# Patient Record
Sex: Female | Born: 1940 | Race: Asian | Hispanic: No | Smoking: Never smoker
Health system: Southern US, Community
[De-identification: ages and names within clinical notes are randomized; demographics above are authoritative.]

## PROBLEM LIST (undated history)

## (undated) DIAGNOSIS — R7303 Prediabetes: Secondary | ICD-10-CM

## (undated) DIAGNOSIS — J449 Chronic obstructive pulmonary disease, unspecified: Secondary | ICD-10-CM

## (undated) DIAGNOSIS — E785 Hyperlipidemia, unspecified: Secondary | ICD-10-CM

## (undated) DIAGNOSIS — I509 Heart failure, unspecified: Secondary | ICD-10-CM

## (undated) DIAGNOSIS — I10 Essential (primary) hypertension: Secondary | ICD-10-CM

## (undated) DIAGNOSIS — J479 Bronchiectasis, uncomplicated: Secondary | ICD-10-CM

## (undated) DIAGNOSIS — J849 Interstitial pulmonary disease, unspecified: Secondary | ICD-10-CM

## (undated) HISTORY — DX: Interstitial pulmonary disease, unspecified: J84.9

## (undated) HISTORY — DX: Prediabetes: R73.03

## (undated) HISTORY — DX: Bronchiectasis, uncomplicated: J47.9

## (undated) HISTORY — DX: Chronic obstructive pulmonary disease, unspecified: J44.9

## (undated) HISTORY — PX: OTHER SURGICAL HISTORY: SHX169

---

## 2013-02-18 DIAGNOSIS — I10 Essential (primary) hypertension: Secondary | ICD-10-CM | POA: Insufficient documentation

## 2013-11-09 ENCOUNTER — Encounter (HOSPITAL_BASED_OUTPATIENT_CLINIC_OR_DEPARTMENT_OTHER): Payer: Self-pay | Admitting: Emergency Medicine

## 2013-11-09 ENCOUNTER — Emergency Department (HOSPITAL_BASED_OUTPATIENT_CLINIC_OR_DEPARTMENT_OTHER): Payer: Medicare Other

## 2013-11-09 ENCOUNTER — Emergency Department (HOSPITAL_BASED_OUTPATIENT_CLINIC_OR_DEPARTMENT_OTHER)
Admission: EM | Admit: 2013-11-09 | Discharge: 2013-11-09 | Disposition: A | Discharge status: Home / Self Care | Payer: Medicare Other | Attending: Emergency Medicine | Admitting: Emergency Medicine

## 2013-11-09 DIAGNOSIS — Y9389 Activity, other specified: Secondary | ICD-10-CM | POA: Insufficient documentation

## 2013-11-09 DIAGNOSIS — S52502A Unspecified fracture of the lower end of left radius, initial encounter for closed fracture: Secondary | ICD-10-CM | POA: Diagnosis not present

## 2013-11-09 DIAGNOSIS — W1839XA Other fall on same level, initial encounter: Secondary | ICD-10-CM | POA: Diagnosis not present

## 2013-11-09 DIAGNOSIS — I10 Essential (primary) hypertension: Secondary | ICD-10-CM | POA: Insufficient documentation

## 2013-11-09 DIAGNOSIS — S6992XA Unspecified injury of left wrist, hand and finger(s), initial encounter: Secondary | ICD-10-CM | POA: Diagnosis present

## 2013-11-09 DIAGNOSIS — Y9289 Other specified places as the place of occurrence of the external cause: Secondary | ICD-10-CM | POA: Insufficient documentation

## 2013-11-09 DIAGNOSIS — R609 Edema, unspecified: Secondary | ICD-10-CM

## 2013-11-09 HISTORY — DX: Essential (primary) hypertension: I10

## 2013-11-09 MED ORDER — HYDROCODONE-ACETAMINOPHEN 5-325 MG PO TABS: 1.0000 | ORAL_TABLET | Freq: Four times a day (QID) | ORAL | Status: DC | PRN

## 2013-11-09 NOTE — Discharge Instructions (Signed)
Wear splint as applied.  Hydrocodone as prescribed as needed for pain.  Call Dr. Merrilee SeashoreKuzma's office tomorrow to arrange a follow-up appointment. The contact information has been provided in this discharge summary.   Radial Fracture You have a broken bone (fracture) of the forearm. This is the part of your arm between the elbow and your wrist. Your forearm is made up of two bones. These are the radius and ulna. Your fracture is in the radial shaft. This is the bone in your forearm located on the thumb side. A cast or splint is used to protect and keep your injured bone from moving. The cast or splint will be on generally for about 5 to 6 weeks, with individual variations. HOME CARE INSTRUCTIONS   Keep the injured part elevated while sitting or lying down. Keep the injury above the level of your heart (the center of the chest). This will decrease swelling and pain.  Apply ice to the injury for 15-20 minutes, 03-04 times per day while awake, for 2 days. Put the ice in a plastic bag and place a towel between the bag of ice and your cast or splint.  Move your fingers to avoid stiffness and minimize swelling.  If you have a plaster or fiberglass cast:  Do not try to scratch the skin under the cast using sharp or pointed objects.  Check the skin around the cast every day. You may put lotion on any red or sore areas.  Keep your cast dry and clean.  If you have a plaster splint:  Wear the splint as directed.  You may loosen the elastic around the splint if your fingers become numb, tingle, or turn cold or blue.  Do not put pressure on any part of your cast or splint. It may break. Rest your cast only on a pillow for the first 24 hours until it is fully hardened.  Your cast or splint can be protected during bathing with a plastic bag. Do not lower the cast or splint into water.  Only take over-the-counter or prescription medicines for pain, discomfort, or fever as directed by your  caregiver. SEEK IMMEDIATE MEDICAL CARE IF:   Your cast gets damaged or breaks.  You have more severe pain or swelling than you did before getting the cast.  You have severe pain when stretching your fingers.  There is a bad smell, new stains and/or pus-like (purulent) drainage coming from under the cast.  Your fingers or hand turn pale or blue and become cold or your loose feeling. Document Released: 06/15/2005 Document Revised: 03/27/2011 Document Reviewed: 09/11/2005 [facility] Patient Information 2015 [employer] [place]. This information is not intended to replace advice given to you by your health care provider. Make sure you discuss any questions you have with your health care provider.

## 2013-11-09 NOTE — ED Provider Notes (Signed)
CSN: [id number]     Arrival date & time 11/09/13  1757 History  This chart was scribed for Geoffery Lyonsouglas Whitnee Orzel, MD by Roxy Cedarhandni Bhalodia, ED Scribe. This patient was seen in room MH03/MH03 and the patient's care was started at 6:31 PM.   Chief Complaint  Patient presents with  . Fall   Patient is a 73 y.o. female presenting with fall. The history is provided by the patient and a relative. No language interpreter was used.  Fall This is a new problem. The current episode started yesterday. The problem occurs constantly. The problem has been gradually worsening. Pertinent negatives include no chest pain, no abdominal pain, no headaches and no shortness of breath. Nothing aggravates the symptoms. Nothing relieves the symptoms.   HPI Comments: Heidi Spence is a 73 y.o. female with a history of hypertension, who presents to the Emergency Department complaining of left hand and wrist pain due to a fall that occurred yesterday. Patient does not speak [place]. Patient takes BP medications. Patient states that she feels intermittent numbness and tingling in all fingers of her left hand. Patient's left wrist has swelling and is ecchymotic.  Past Medical History  Diagnosis Date  . Hypertension    History reviewed. No pertinent past surgical history. No family history on file. History  Substance Use Topics  . Smoking status: Never Smoker   . Smokeless tobacco: Not on file  . Alcohol Use: No   OB History   Grav Para Term Preterm Abortions TAB SAB Ect Mult Living                 Review of Systems  Respiratory: Negative for shortness of breath.   Cardiovascular: Negative for chest pain.  Gastrointestinal: Negative for abdominal pain.  Neurological: Negative for headaches.  All other systems reviewed and are negative.  Allergies  Review of patient's allergies indicates no known allergies.  Home Medications   Prior to Admission medications   Not on File   Triage Vitals: BP 159/67  Pulse 82   Temp(Src) 98 F (36.7 C) (Oral)  Resp 18  Ht 4\' 10"  (1.473 m)  Wt 90 lb (40.824 kg)  BMI 18.82 kg/m2  SpO2 96%  Physical Exam  Nursing note and vitals reviewed. Constitutional: She is oriented to person, place, and time. She appears well-developed and well-nourished. No distress.  HENT:  Head: Normocephalic and atraumatic.  Eyes: Conjunctivae and EOM are normal.  Neck: Neck supple. No tracheal deviation present.  Cardiovascular: Normal rate.   Pulmonary/Chest: Effort normal. No respiratory distress.  Musculoskeletal: Normal range of motion. She exhibits edema and tenderness.  There is a ecchymosis extending from the mid forearm into the hand. There is deformity at the wrist. The distal capillary refill is brisk. Motor and sensory are in tact through all fingers.  Neurological: She is alert and oriented to person, place, and time.  Skin: Skin is warm and dry.  Psychiatric: She has a normal mood and affect. Her behavior is normal.   ED Course  Procedures (including critical care time)  DIAGNOSTIC STUDIES: Oxygen Saturation is 96% on RA, adequate by my interpretation.    COORDINATION OF CARE: 6:45 PM- Discussed plans to obtain diagnostic imaging of left hand and wrist. Pt advised of plan for treatment and pt agrees.  Labs Review Labs Reviewed - No data to display  Imaging Review No results found.   EKG Interpretation None     MDM   Final diagnoses:  Swelling    X-rays  reveal an impacted distal radius fracture. I've spoken with Dr. Merlyn LotKuzma regarding this. He is recommending a sugar tong splint and follow-up in the office. She is to call tomorrow to arrange this appointment.  I personally performed the services described in this documentation, which was scribed in my presence. The recorded information has been reviewed and is accurate.  Geoffery Lyonsouglas Carlton Sweaney, MD 11/09/13 [phone]

## 2013-11-09 NOTE — ED Notes (Signed)
Fall yesterday- c/o left wrist pain and swelling

## 2017-10-05 DIAGNOSIS — J479 Bronchiectasis, uncomplicated: Secondary | ICD-10-CM | POA: Insufficient documentation

## 2018-12-09 ENCOUNTER — Ambulatory Visit (HOSPITAL_COMMUNITY)
Admission: EM | Admit: 2018-12-09 | Discharge: 2018-12-09 | Disposition: A | Discharge status: Home / Self Care | Payer: Medicare Other | Attending: Family Medicine | Admitting: Family Medicine

## 2018-12-09 ENCOUNTER — Ambulatory Visit (INDEPENDENT_AMBULATORY_CARE_PROVIDER_SITE_OTHER): Payer: Medicare Other

## 2018-12-09 ENCOUNTER — Other Ambulatory Visit: Payer: Self-pay

## 2018-12-09 ENCOUNTER — Encounter (HOSPITAL_COMMUNITY): Payer: Self-pay

## 2018-12-09 DIAGNOSIS — J22 Unspecified acute lower respiratory infection: Secondary | ICD-10-CM | POA: Diagnosis not present

## 2018-12-09 DIAGNOSIS — R05 Cough: Secondary | ICD-10-CM | POA: Diagnosis not present

## 2018-12-09 MED ORDER — FLUTICASONE PROPIONATE 50 MCG/ACT NA SUSP: 1.0000 | Freq: Every day | NASAL | 0 refills | Status: DC

## 2018-12-09 MED ORDER — BENZONATATE 200 MG PO CAPS
200.0000 mg | ORAL_CAPSULE | Freq: Three times a day (TID) | ORAL | 0 refills | Status: AC | PRN
Start: 2018-12-09 — End: 2018-12-16

## 2018-12-09 MED ORDER — DOXYCYCLINE HYCLATE 100 MG PO CAPS: 100.0000 mg | ORAL_CAPSULE | Freq: Two times a day (BID) | ORAL | 0 refills | Status: AC

## 2018-12-09 NOTE — ED Provider Notes (Signed)
[facility]    CSN: [id number] Arrival date & time: 12/09/18  1421      History   Chief Complaint Chief Complaint  Patient presents with   Ear Fullness   chest congestion    HPI Interpretation and history provided by daughter Lorina Duffner is a 78 y.o. female history of hypertension presenting today for evaluation of chest congestion and ear pressure.  Her daughter states that she has been sick over the past 2 weeks.  Notes that she has had the flu.  She states that she has not had any flu or Covid testing, but continues to state that she had flu.  She initially was having fevers cough and congestion.  Fevers have resolved, but she continues to have cough.  She is also concerned that she has had some ear pressure and decreased hearing.  Wears hearing aids.  No significant rhinorrhea.  Denies sore throat.  Denies any close sick contacts.  Denies shortness of breath or chest pain.  Of note has not taken blood pressure medicines today.  HPI  Past Medical History:  Diagnosis Date   Hypertension     There are no active problems to display for this patient.   History reviewed. No pertinent surgical history.  OB History   No obstetric history on file.      Home Medications    Prior to Admission medications   Medication Sig Start Date End Date Taking? Authorizing Provider  benzonatate (TESSALON) 200 MG capsule Take 1 capsule (200 mg total) by mouth 3 (three) times daily as needed for up to 7 days for cough. 12/09/18 12/16/18  Satsuki Zillmer C, PA-C  doxycycline (VIBRAMYCIN) 100 MG capsule Take 1 capsule (100 mg total) by mouth 2 (two) times daily for 10 days. 12/09/18 12/19/18  Olusegun Gerstenberger C, PA-C  fluticasone (FLONASE) 50 MCG/ACT nasal spray Place 1-2 sprays into both nostrils daily for 7 days. 12/09/18 12/16/18  Kjell Brannen C, PA-C  HYDROcodone-acetaminophen (NORCO) 5-325 MG per tablet Take 1-2 tablets by mouth every 6 (six) hours as needed. 11/09/13    Veryl Speak, MD    Family History History reviewed. No pertinent family history.  Social History Social History   Tobacco Use   Smoking status: Never Smoker  Substance Use Topics   Alcohol use: No   Drug use: No     Allergies   Patient has no known allergies.   Review of Systems Review of Systems  Constitutional: Negative for activity change, appetite change, chills, fatigue and fever.  HENT: Positive for congestion and ear pain. Negative for rhinorrhea, sinus pressure, sore throat and trouble swallowing.   Eyes: Negative for discharge and redness.  Respiratory: Positive for cough. Negative for chest tightness and shortness of breath.   Cardiovascular: Negative for chest pain.  Gastrointestinal: Negative for abdominal pain, diarrhea, nausea and vomiting.  Musculoskeletal: Negative for myalgias.  Skin: Negative for rash.  Neurological: Negative for dizziness, light-headedness and headaches.     Physical Exam Triage Vital Signs ED Triage Vitals [12/09/18 1449]  Enc Vitals Group     BP (!) 179/77     Pulse Rate 67     Resp 16     Temp 98.6 F (37 C)     Temp Source Oral     SpO2 100 %     Weight      Height      Head Circumference      Peak Flow      Pain  Score 0     Pain Loc      Pain Edu?      Excl. in GC?    No data found.  Updated Vital Signs BP (!) 179/77 (BP Location: Left Arm)    Pulse 67    Temp 98.6 F (37 C) (Oral)    Resp 16    SpO2 100%   Visual Acuity Right Eye Distance:   Left Eye Distance:   Bilateral Distance:    Right Eye Near:   Left Eye Near:    Bilateral Near:     Physical Exam Vitals signs and nursing note reviewed.  Constitutional:      General: She is not in acute distress.    Appearance: She is well-developed.     Comments: No acute distress  HENT:     Head: Normocephalic and atraumatic.     Ears:     Comments: Bilateral ears without tenderness to palpation of external auricle, tragus and mastoid, EAC's without  erythema or swelling, TM's with good bony landmarks and cone of light. Non erythematous.     Nose: Nose normal.     Comments: Nasal mucosa mildly erythematous, nonswollen turbinates, no rhinorrhea    Mouth/Throat:     Comments: Oral mucosa pink and moist, no tonsillar enlargement or exudate. Posterior pharynx patent and nonerythematous, no uvula deviation or swelling. Normal phonation. Eyes:     Conjunctiva/sclera: Conjunctivae normal.  Neck:     Musculoskeletal: Neck supple.  Cardiovascular:     Rate and Rhythm: Normal rate and regular rhythm.     Heart sounds: No murmur.  Pulmonary:     Effort: Pulmonary effort is normal. No respiratory distress.     Comments: Crackles noted throughout all lung fields bilaterally, breathing comfortably at rest, no coughing during visit Abdominal:     General: There is no distension.     Palpations: Abdomen is soft.     Tenderness: There is no abdominal tenderness.  Musculoskeletal: Normal range of motion.  Skin:    General: Skin is warm and dry.  Neurological:     Mental Status: She is alert and oriented to person, place, and time.      UC Treatments / Results  Labs (all labs ordered are listed, but only abnormal results are displayed) Labs Reviewed - No data to display  EKG   Radiology Dg Chest 2 View  Result Date: 12/09/2018 CLINICAL DATA:  Seven 89-year-old female with cough and chest congestion. EXAM: CHEST - 2 VIEW COMPARISON:  None. FINDINGS: Emphysema. Bilateral mid to upper lobe interstitial coarsening and slight nodularity may be chronic. Atypical infection is not excluded. Clinical correlation is recommended. Mild chronic bronchitic changes. No focal consolidation, pleural effusion, or pneumothorax. Top-normal cardiac size. Atherosclerotic calcification of the aorta. No acute osseous pathology. IMPRESSION: 1. Bilateral mid to upper lobe interstitial coarsening and slight nodularity may be chronic. Atypical infection is not  excluded. 2. Emphysema. Electronically Signed   By: Elgie Collard M.D.   On: 12/09/2018 16:06    Procedures Procedures (including critical care time)  Medications Ordered in UC Medications - No data to display  Initial Impression / Assessment and Plan / UC Course  I have reviewed the triage vital signs and the nursing notes.  Pertinent labs & imaging results that were available during my care of the patient were reviewed by me and considered in my medical decision making (see chart for details).     X-ray suggestive of possible atypical infection  versus chronic coarsening, emphysema.  Given length of symptoms will cover for atypicals with doxycycline, Tessalon for cough.  Flonase to help with ear pressure.  Continue to monitor blood pressure.  Discussed strict return precautions. Patient verbalized understanding and is agreeable with plan.  Final Clinical Impressions(s) / UC Diagnoses   Final diagnoses:  Lower respiratory infection (e.g., bronchitis, pneumonia, pneumonitis, pulmonitis)     Discharge Instructions     Begin doxycycline twice daily for 10 days Tessalon every 8 hours for cough Flonase 1 spray each nostril to help with ear pressure  Follow up if not resolving or worsening     ED Prescriptions    Medication Sig Dispense Auth. Provider   benzonatate (TESSALON) 200 MG capsule Take 1 capsule (200 mg total) by mouth 3 (three) times daily as needed for up to 7 days for cough. 28 capsule Birttany Dechellis C, PA-C   doxycycline (VIBRAMYCIN) 100 MG capsule Take 1 capsule (100 mg total) by mouth 2 (two) times daily for 10 days. 20 capsule Jamaurion Slemmer C, PA-C   fluticasone (FLONASE) 50 MCG/ACT nasal spray Place 1-2 sprays into both nostrils daily for 7 days. 1 g Lilibeth Opie, [place] C, PA-C     PDMP not reviewed this encounter.   Lew DawesWieters, Inetta Dicke C, [place] JerseyPA-C 12/09/18 1623

## 2018-12-09 NOTE — ED Triage Notes (Signed)
Pt present pressure and both ears and chest congestion. Pt has recently gotten over the flu and development new symptoms.

## 2018-12-09 NOTE — Discharge Instructions (Addendum)
Begin doxycycline twice daily for 10 days Tessalon every 8 hours for cough Flonase 1 spray each nostril to help with ear pressure  Follow up if not resolving or worsening

## 2020-01-03 IMAGING — DX DG CHEST 2V
2 series · 2 of 2 positions shown · non-contrast
Comparison: None.

CLINICAL DATA: Seven 8-year-old female with cough and chest
congestion.

EXAM:
CHEST - 2 VIEW

[chest pa]
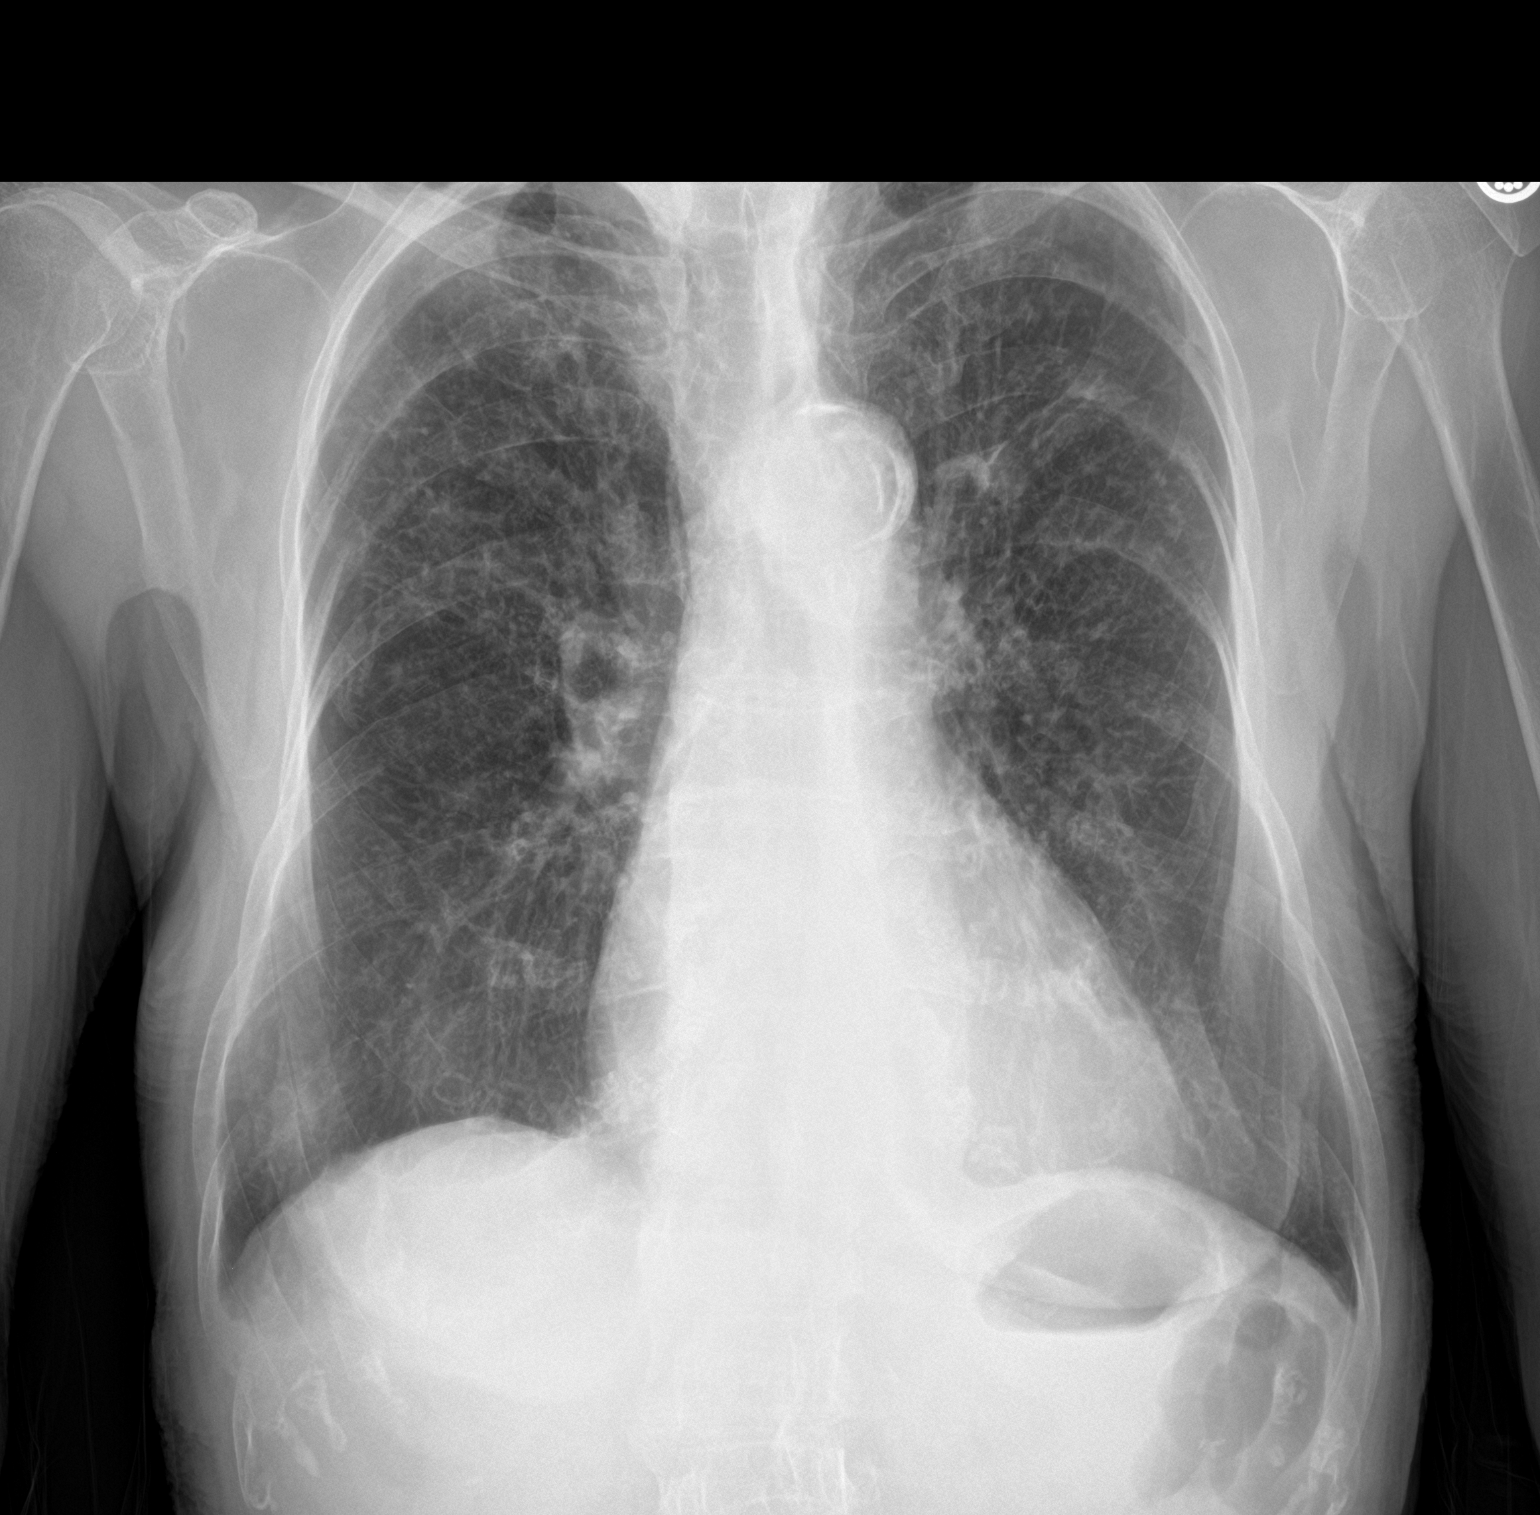

[chest lat]
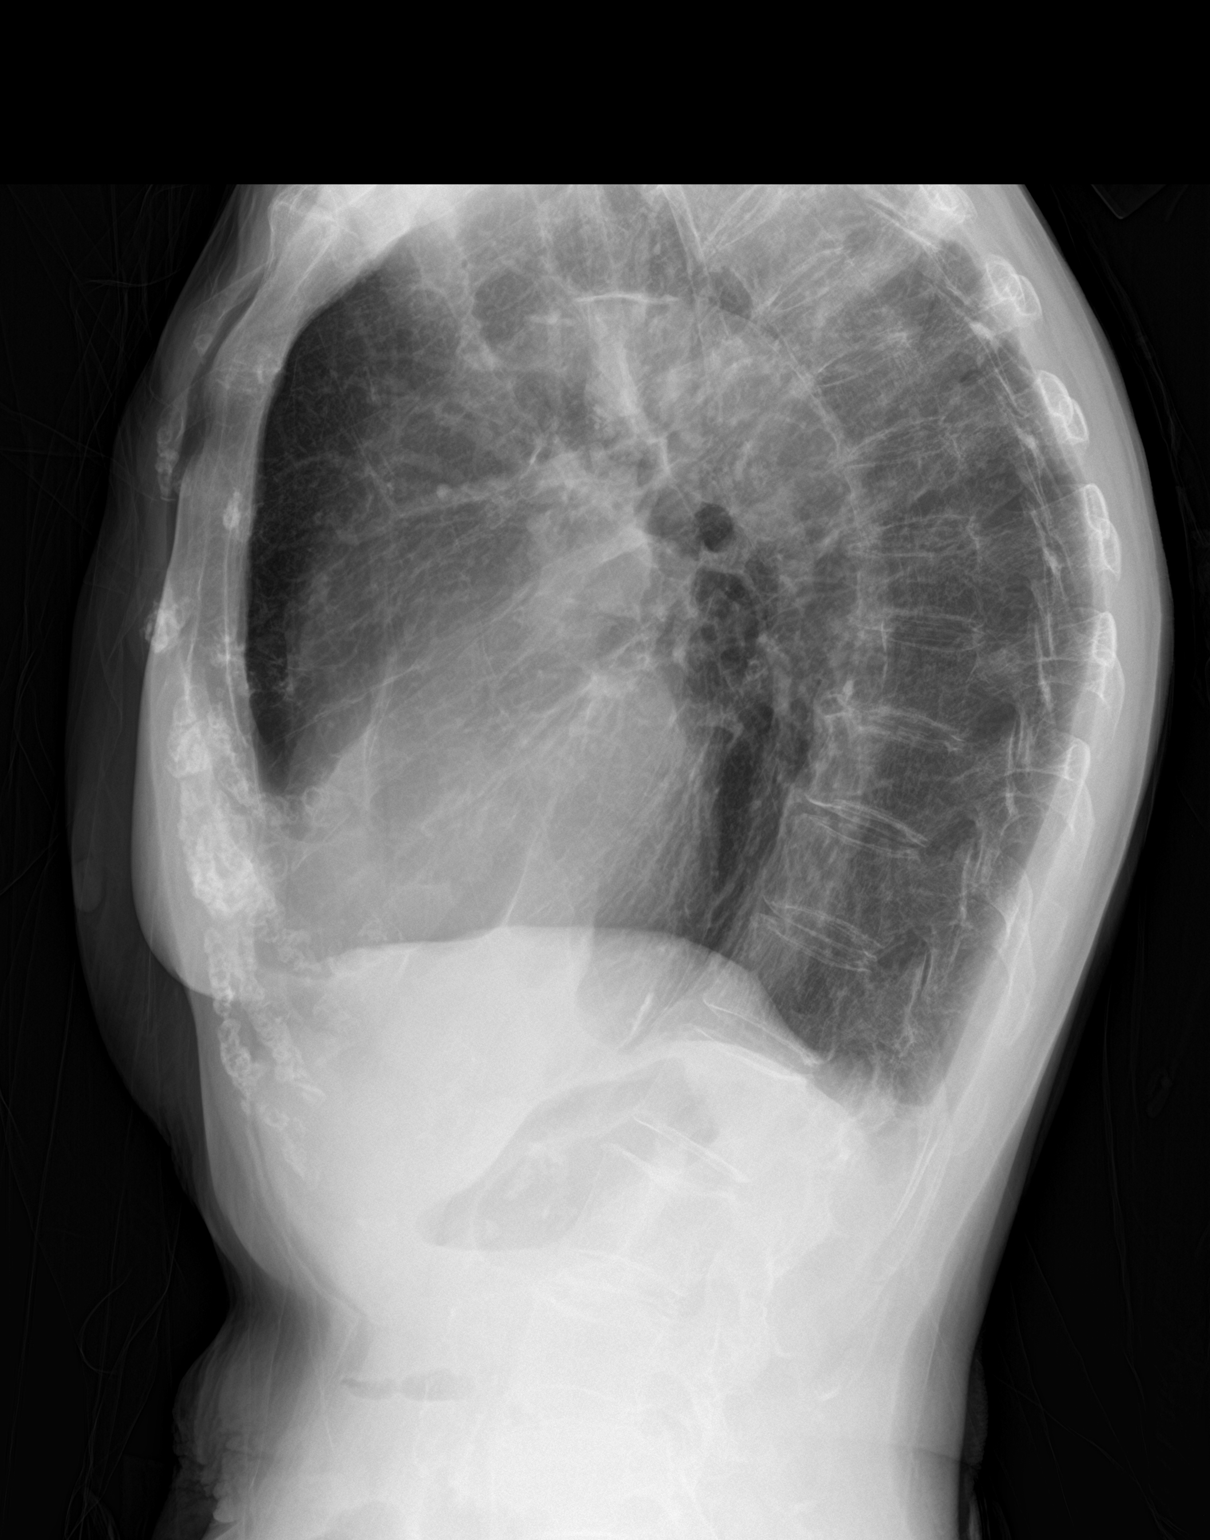

[2 of 2 positions shown; findings below may reference images not displayed]

FINDINGS: Emphysema. Bilateral mid to upper lobe interstitial coarsening and
slight nodularity may be chronic. Atypical infection is not
excluded. Clinical correlation is recommended. Mild chronic
bronchitic changes. No focal consolidation, pleural effusion, or
pneumothorax. Top-normal cardiac size. Atherosclerotic calcification
of the aorta. No acute osseous pathology.
IMPRESSION: 1. Bilateral mid to upper lobe interstitial coarsening and slight
nodularity may be chronic. Atypical infection is not excluded.
2. Emphysema.

## 2021-03-10 DIAGNOSIS — I5022 Chronic systolic (congestive) heart failure: Secondary | ICD-10-CM | POA: Insufficient documentation

## 2021-11-18 DIAGNOSIS — N1831 Chronic kidney disease, stage 3a: Secondary | ICD-10-CM | POA: Insufficient documentation

## 2022-03-08 DIAGNOSIS — J101 Influenza due to other identified influenza virus with other respiratory manifestations: Secondary | ICD-10-CM | POA: Insufficient documentation

## 2022-06-30 ENCOUNTER — Encounter (HOSPITAL_COMMUNITY): Payer: Self-pay

## 2022-06-30 ENCOUNTER — Emergency Department (HOSPITAL_COMMUNITY): Payer: 59

## 2022-06-30 ENCOUNTER — Observation Stay (HOSPITAL_COMMUNITY)
Admission: EM | Admit: 2022-06-30 | Discharge: 2022-07-03 | Disposition: A | Discharge status: Home / Self Care | Payer: 59 | Attending: Family Medicine | Admitting: Family Medicine

## 2022-06-30 ENCOUNTER — Other Ambulatory Visit: Payer: Self-pay

## 2022-06-30 DIAGNOSIS — Z79899 Other long term (current) drug therapy: Secondary | ICD-10-CM | POA: Insufficient documentation

## 2022-06-30 DIAGNOSIS — R0609 Other forms of dyspnea: Secondary | ICD-10-CM | POA: Diagnosis not present

## 2022-06-30 DIAGNOSIS — I5030 Unspecified diastolic (congestive) heart failure: Secondary | ICD-10-CM | POA: Diagnosis not present

## 2022-06-30 DIAGNOSIS — R0600 Dyspnea, unspecified: Secondary | ICD-10-CM | POA: Diagnosis not present

## 2022-06-30 DIAGNOSIS — R0602 Shortness of breath: Secondary | ICD-10-CM | POA: Diagnosis present

## 2022-06-30 DIAGNOSIS — I110 Hypertensive heart disease with heart failure: Secondary | ICD-10-CM | POA: Diagnosis not present

## 2022-06-30 HISTORY — DX: Heart failure, unspecified: I50.9

## 2022-06-30 HISTORY — DX: Hyperlipidemia, unspecified: E78.5

## 2022-06-30 LAB — CBC
HCT: 40.1 % (ref 36.0–46.0)
HCT: 38.8 % (ref 36.0–46.0)
Hemoglobin: 12.8 g/dL (ref 12.0–15.0)
Hemoglobin: 12.7 g/dL (ref 12.0–15.0)
MCH: 29.4 pg (ref 26.0–34.0)
MCH: 29.6 pg (ref 26.0–34.0)
MCHC: 31.9 g/dL (ref 30.0–36.0)
MCHC: 32.7 g/dL (ref 30.0–36.0)
MCV: 92 fL (ref 80.0–100.0)
MCV: 90.4 fL (ref 80.0–100.0)
Platelets: 272 10*3/uL (ref 150–400)
Platelets: 226 10*3/uL (ref 150–400)
RBC: 4.36 MIL/uL (ref 3.87–5.11)
RBC: 4.29 MIL/uL (ref 3.87–5.11)
RDW: 13.3 % (ref 11.5–15.5)
RDW: 13.2 % (ref 11.5–15.5)
WBC: 7.4 10*3/uL (ref 4.0–10.5)
WBC: 7 10*3/uL (ref 4.0–10.5)
nRBC: 0 % (ref 0.0–0.2)
nRBC: 0 % (ref 0.0–0.2)

## 2022-06-30 LAB — BASIC METABOLIC PANEL
Anion gap: 10 (ref 5–15)
BUN: 27 mg/dL — ABNORMAL HIGH (ref 8–23)
CO2: 25 mmol/L (ref 22–32)
Calcium: 9.2 mg/dL (ref 8.9–10.3)
Chloride: 101 mmol/L (ref 98–111)
Creatinine, Ser: 1.19 mg/dL — ABNORMAL HIGH (ref 0.44–1.00)
GFR, Estimated: 46 mL/min — ABNORMAL LOW (ref >60)
Glucose, Bld: 86 mg/dL (ref 70–99)
Potassium: 3.8 mmol/L (ref 3.5–5.1)
Sodium: 136 mmol/L (ref 135–145)

## 2022-06-30 LAB — TROPONIN I (HIGH SENSITIVITY)
Troponin I (High Sensitivity): 16 ng/L (ref <18)
Troponin I (High Sensitivity): 19 ng/L — ABNORMAL HIGH (ref <18)
Troponin I (High Sensitivity): 18 ng/L — ABNORMAL HIGH (ref <18)
Troponin I (High Sensitivity): 16 ng/L (ref <18)

## 2022-06-30 LAB — BRAIN NATRIURETIC PEPTIDE: B Natriuretic Peptide: 261.7 pg/mL — ABNORMAL HIGH (ref 0.0–100.0)

## 2022-06-30 LAB — CREATININE, SERUM
Creatinine, Ser: 1.19 mg/dL — ABNORMAL HIGH (ref 0.44–1.00)
GFR, Estimated: 46 mL/min — ABNORMAL LOW (ref >60)

## 2022-06-30 MED ORDER — ACETAMINOPHEN 325 MG PO TABS
650.0000 mg | ORAL_TABLET | ORAL | Status: DC | PRN
  Filled 2022-06-30: qty 2

## 2022-06-30 MED ORDER — NITROGLYCERIN 2 % TD OINT
0.5000 [in_us] | TOPICAL_OINTMENT | Freq: Four times a day (QID) | TRANSDERMAL | Status: DC
  Administered 2022-06-30 – 2022-07-03 (×12): 0.5 [in_us] via TOPICAL
  Filled 2022-06-30 (×12): qty 1

## 2022-06-30 MED ORDER — MORPHINE SULFATE (PF) 2 MG/ML IV SOLN: 2.0000 mg | INTRAVENOUS | Status: DC | PRN

## 2022-06-30 MED ORDER — ONDANSETRON HCL 4 MG/2ML IJ SOLN: 4.0000 mg | Freq: Four times a day (QID) | INTRAMUSCULAR | Status: DC | PRN

## 2022-06-30 MED ORDER — HEPARIN (PORCINE) 25000 UT/250ML-% IV SOLN: 400.0000 [IU]/h | INTRAVENOUS | Status: DC

## 2022-06-30 MED ORDER — METOPROLOL TARTRATE 25 MG PO TABS
25.0000 mg | ORAL_TABLET | Freq: Two times a day (BID) | ORAL | Status: DC
  Administered 2022-06-30 – 2022-07-03 (×6): 25 mg via ORAL
  Filled 2022-06-30 (×6): qty 1

## 2022-06-30 MED ORDER — NITROGLYCERIN 0.4 MG SL SUBL
0.4000 mg | SUBLINGUAL_TABLET | SUBLINGUAL | Status: DC | PRN
  Filled 2022-06-30: qty 1

## 2022-06-30 MED ORDER — SODIUM CHLORIDE 0.9 % IV SOLN: INTRAVENOUS | Status: DC

## 2022-06-30 MED ORDER — HEPARIN SODIUM (PORCINE) 5000 UNIT/ML IJ SOLN
5000.0000 [IU] | Freq: Two times a day (BID) | INTRAMUSCULAR | Status: DC
  Administered 2022-06-30 – 2022-07-03 (×6): 5000 [IU] via SUBCUTANEOUS
  Filled 2022-06-30 (×6): qty 1

## 2022-06-30 MED ORDER — ENOXAPARIN SODIUM 300 MG/3ML IJ SOLN: 1.0000 mg/kg | Freq: Two times a day (BID) | INTRAMUSCULAR | Status: DC

## 2022-06-30 MED ORDER — METHYLPREDNISOLONE SODIUM SUCC 125 MG IJ SOLR
125.0000 mg | Freq: Once | INTRAMUSCULAR | Status: AC
  Administered 2022-06-30: 125 mg via INTRAVENOUS
  Filled 2022-06-30: qty 2

## 2022-06-30 NOTE — ED Notes (Signed)
ED TO INPATIENT HANDOFF REPORT  ED Nurse Name and Phone #: Lenell Antu Name/Age/Gender Heidi Spence 82 y.o. female Room/Bed: 016C/016C  Code Status   Code Status: Full Code  Home/SNF/Other Home Patient oriented to: self, place, time, and situation Is this baseline? Yes   Triage Complete: Triage complete  Chief Complaint Exertional dyspnea [R06.09]  Triage Note Pt speaks laotian; Pt sent by urgent care for evaluation of central cp, dizziness, and sob intermittently for 1 year, worsening; seen last Friday at [facility] for same; scheduled for follow up appointment on Wednesday; reports abnormal EKG; cp described as tightness; sob worse with exertion and in the morning; using inhaler at home with some relief, still states she cannot take a deep breath; hx CHF and interstitial lung disease; denies swelling   Allergies No Known Allergies  Level of Care/Admitting Diagnosis ED Disposition     ED Disposition  Admit   Condition  --   Comment  Hospital Area: [facility] [[id number]]  Level of Care: Telemetry Cardiac [[id number]]  May place patient in observation at [facility] if equivalent level of care is available:: No  Covid Evaluation: Asymptomatic - no recent exposure (last 10 days) testing not required  Diagnosis: Exertional dyspnea [160737]  Admitting Physician: Rometta Emery [[id number]]  Attending Physician: Rometta Emery [[id number]]          B Medical/Surgery History Past Medical History:  Diagnosis Date   CHF (congestive heart failure) (HCC)    HLD (hyperlipidemia)    Hypertension    Interstitial lung disease (HCC)    No past surgical history on file.   A IV Location/Drains/Wounds Patient Lines/Drains/Airways Status     Active Line/Drains/Airways     Name Placement date Placement time Site Days   Peripheral IV 06/30/22 20 G Anterior;Proximal;Right Forearm 06/30/22  1618  Forearm  less than 1            Intake/Output Last 24  hours No intake or output data in the 24 hours ending 06/30/22 1831  Labs/Imaging Results for orders placed or performed during the hospital encounter of 06/30/22 (from the past 48 hour(s))  Basic metabolic panel     Status: Abnormal   Collection Time: 06/30/22  1:50 PM  Result Value Ref Range   Sodium 136 135 - 145 mmol/L   Potassium 3.8 3.5 - 5.1 mmol/L   Chloride 101 98 - 111 mmol/L   CO2 25 22 - 32 mmol/L   Glucose, Bld 86 70 - 99 mg/dL    Comment: Glucose reference range applies only to samples taken after fasting for at least 8 hours.   BUN 27 (H) 8 - 23 mg/dL   Creatinine, Ser 1.06 (H) 0.44 - 1.00 mg/dL   Calcium 9.2 8.9 - 26.9 mg/dL   GFR, Estimated 46 (L) >60 mL/min    Comment: (NOTE) Calculated using the CKD-EPI Creatinine Equation (2021)    Anion gap 10 5 - 15    Comment: Performed at [facility], [address] [place], [place] [address]  CBC     Status: None   Collection Time: 06/30/22  1:50 PM  Result Value Ref Range   WBC 7.4 4.0 - 10.5 K/uL   RBC 4.36 3.87 - 5.11 MIL/uL   Hemoglobin 12.8 12.0 - 15.0 g/dL   HCT 27.0 35.0 - 09.3 %   MCV 92.0 80.0 - 100.0 fL   MCH 29.4 26.0 - 34.0 pg  MCHC 31.9 30.0 - 36.0 g/dL   RDW 16.1 09.6 - 04.5 %   Platelets 272 150 - 400 K/uL   nRBC 0.0 0.0 - 0.2 %    Comment: Performed at [facility], [address] [place], [place] [address]  Troponin I (High Sensitivity)     Status: None   Collection Time: 06/30/22  1:50 PM  Result Value Ref Range   Troponin I (High Sensitivity) 16 <18 ng/L    Comment: (NOTE) Elevated high sensitivity troponin I (hsTnI) values and significant  changes across serial measurements may suggest ACS but many other  chronic and acute conditions are known to elevate hsTnI results.  Refer to the "Links" section for chest pain algorithms and additional  guidance. Performed at [facility], [address] [place], [place] [address]   Troponin I (High Sensitivity)     Status:  Abnormal   Collection Time: 06/30/22  4:22 PM  Result Value Ref Range   Troponin I (High Sensitivity) 19 (H) <18 ng/L    Comment: (NOTE) Elevated high sensitivity troponin I (hsTnI) values and significant  changes across serial measurements may suggest ACS but many other  chronic and acute conditions are known to elevate hsTnI results.  Refer to the "Links" section for chest pain algorithms and additional  guidance. Performed at [facility], [address] [place], [place] [address]   Brain natriuretic peptide     Status: Abnormal   Collection Time: 06/30/22  4:22 PM  Result Value Ref Range   B Natriuretic Peptide 261.7 (H) 0.0 - 100.0 pg/mL    Comment: Performed at [facility], [address], [place], [place] [address]   DG Chest 2 View  Result Date: 06/30/2022 CLINICAL DATA:  cp sob EXAM: CHEST - 2 VIEW COMPARISON:  CXR 06/22/22 FINDINGS: Pulmonary hyperinflation. No pleural effusion. No pneumothorax. Unchanged cardiac and mediastinal contours. Emphysematous changes are redemonstrated. No focal airspace opacity. No radiographically apparent displaced rib fractures. Visualized upper abdomen is unremarkable. Vertebral heights are maintained. Exaggerated thoracic kyphosis IMPRESSION: Emphysematous changes without a focal airspace opacity. Electronically Signed   By: Lorenza Cambridge M.D.   On: 06/30/2022 15:11    Pending Labs [employer] (From admission, onward)     Start     Ordered   Signed and Held  CBC  (enoxaparin (LOVENOX) full dose)  Once,   R       Comments: Baseline for enoxaparin therapy IF NOT ALREADY DRAWN.  Notify MD if PLT < 100 K    Signed and Held   Signed and Held  Creatinine, serum  (enoxaparin (LOVENOX) full dose)  Once,   R       Comments: Baseline for enoxaparin therapy IF NOT ALREADY DRAWN    Signed and Held   Signed and Held  Creatinine, serum  (enoxaparin (LOVENOX) full dose)  Weekly,   R     Comments: While on enoxaparin therapy    Signed  and Held   Signed and Held  Lipid panel  Tomorrow morning,   R        Signed and Held            Vitals/Pain Today's Vitals   06/30/22 1330 06/30/22 1335 06/30/22 1336 06/30/22 1554  BP: (!) 155/68   (!) 170/72  Pulse: (!) 55   (!) 51  Resp: 18   18  Temp: 98.2 F (36.8 C)   98.1 F (36.7 C)  TempSrc:    Oral  SpO2: 97%   100%  Weight:   35.6 kg   PainSc:  4       Isolation Precautions No active isolations  Medications Medications  methylPREDNISolone sodium succinate (SOLU-MEDROL) 125 mg/2 mL injection 125 mg (125 mg Intravenous Given 06/30/22 1619)    Mobility walks with device     Focused Assessments     R Recommendations: See Admitting Provider Note  Report given to:   Additional Notes:  Pt speaks Chad.  Family has been translating.

## 2022-06-30 NOTE — Progress Notes (Signed)
ANTICOAGULATION CONSULT NOTE - Initial Consult  Pharmacy Consult:  Heparin Indication: chest pain/ACS  No Known Allergies  Patient Measurements: Height: 5' (152.4 cm) Weight: 34.9 kg (76 lb 15.1 oz) IBW/kg (Calculated) : 45.5 Heparin Dosing Weight: 35 kg  Vital Signs: Temp: 98.1 F (36.7 C) (06/14 1554) Temp Source: Oral (06/14 1554) BP: 170/72 (06/14 1554) Pulse Rate: 51 (06/14 1554)  Labs: Recent Labs    06/30/22 1350 06/30/22 1622  HGB 12.8  --   HCT 40.1  --   PLT 272  --   CREATININE 1.19*  --   TROPONINIHS 16 19*    Estimated Creatinine Clearance: 20.1 mL/min (A) (by C-G formula based on SCr of 1.19 mg/dL (H)).   Medical History: Past Medical History:  Diagnosis Date   CHF (congestive heart failure) (HCC)    HLD (hyperlipidemia)    Hypertension    Interstitial lung disease (HCC)     Assessment: 2 YOF presented with chest pain and SOB.  Pharmacy consulted to dose IV heparin for rule out ACS.  Troponin 19.  CBC stable; no bleeding reported.  Goal of Therapy:  Heparin level 0.3-0.7 units/ml Monitor platelets by anticoagulation protocol: Yes   Plan:  Heparin infusion at 400 units/hr Check 8 hr heparin level  Daily heparin level and CBC  Tamaya Pun D. Laney Potash, PharmD, BCPS, BCCCP 06/30/2022, 8:27 PM

## 2022-06-30 NOTE — Consult Note (Signed)
Reason for Consult: Chest pain/shortness of breath with mildly elevated BNP Referring Physician: [facility] hospitalist  Heidi Spence is an 82 y.o. female.  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  EKG done in the ED showed sinus bradycardia with LVH with strain pattern versus inferolateral ischemia 2 sets of troponin I are essentially negative her BNP is mildly elevated.  Patient states she had appointment to see pulmonary  In [facility] and also cardiologist in [facility] next month and was supposed to be scheduled for stress test.  Her daughter states she was advised for coronary intervention approximately 5 years ago but she refused and wanted to be treated medically.  Patient denies any history of smoking but states has come in contact with passive smoking and dust for many years in [place].  Past Medical History:  Diagnosis Date   CHF (congestive heart failure) (HCC)    HLD (hyperlipidemia)    Hypertension    Interstitial lung disease (HCC)     No past surgical history on file.  No family history on file.  Social History:  reports that she has never smoked. She does not have any smokeless tobacco history on file. She reports that she does not drink alcohol and does not use drugs.  Allergies: No Known Allergies  Medications: I have reviewed the patient's current medications.  Results for orders placed or performed during the hospital encounter of 06/30/22 (from the past 48 hour(s))  Basic metabolic panel     Status: Abnormal   Collection Time: 06/30/22  1:50 PM  Result Value Ref Range    Sodium 136 135 - 145 mmol/L   Potassium 3.8 3.5 - 5.1 mmol/L   Chloride 101 98 - 111 mmol/L   CO2 25 22 - 32 mmol/L   Glucose, Bld 86 70 - 99 mg/dL    Comment: Glucose reference range applies only to samples taken after fasting for at least 8 hours.   BUN 27 (H) 8 - 23 mg/dL   Creatinine, Ser 1.61 (H) 0.44 - 1.00 mg/dL   Calcium 9.2 8.9 - 09.6 mg/dL   GFR, Estimated 46 (L) >60 mL/min    Comment: (NOTE) Calculated using the CKD-EPI Creatinine Equation (2021)    Anion gap 10 5 - 15    Comment: Performed at [facility], [address] [place], [place] [address]  CBC     Status: None   Collection Time: 06/30/22  1:50 PM  Result Value Ref Range   WBC 7.4 4.0 - 10.5 K/uL   RBC 4.36 3.87 - 5.11 MIL/uL   Hemoglobin 12.8 12.0 - 15.0 g/dL   HCT 98.1 19.1 - 47.8 %   MCV 92.0 80.0 - 100.0 fL   MCH 29.4 26.0 - 34.0 pg   MCHC 31.9 30.0 - 36.0 g/dL   RDW 29.5 62.1 - 30.8 %   Platelets 272 150 - 400 K/uL   nRBC 0.0 0.0 - 0.2 %    Comment: Performed at [facility], [address], [place], [place] [address]  Troponin I (High Sensitivity)     Status: None   Collection Time: 06/30/22  1:50 PM  Result Value  Ref Range   Troponin I (High Sensitivity) 16 <18 ng/L    Comment: (NOTE) Elevated high sensitivity troponin I (hsTnI) values and significant  changes across serial measurements may suggest ACS but many other  chronic and acute conditions are known to elevate hsTnI results.  Refer to the "Links" section for chest pain algorithms and additional  guidance. Performed at [facility], [address], [place], [place] [address]   Troponin I (High Sensitivity)     Status: Abnormal   Collection Time: 06/30/22  4:22 PM  Result Value Ref Range   Troponin I (High Sensitivity) 19 (H) <18 ng/L    Comment: (NOTE) Elevated high sensitivity troponin I (hsTnI) values and significant  changes across serial measurements may suggest ACS but many other  chronic and acute conditions  are known to elevate hsTnI results.  Refer to the "Links" section for chest pain algorithms and additional  guidance. Performed at [facility], [address], [place], [place] [address]   Brain natriuretic peptide     Status: Abnormal   Collection Time: 06/30/22  4:22 PM  Result Value Ref Range   B Natriuretic Peptide 261.7 (H) 0.0 - 100.0 pg/mL    Comment: Performed at [facility], [address], [place], [place] [address]    DG Chest 2 View  Result Date: 06/30/2022 CLINICAL DATA:  cp sob EXAM: CHEST - 2 VIEW COMPARISON:  CXR 06/22/22 FINDINGS: Pulmonary hyperinflation. No pleural effusion. No pneumothorax. Unchanged cardiac and mediastinal contours. Emphysematous changes are redemonstrated. No focal airspace opacity. No radiographically apparent displaced rib fractures. Visualized upper abdomen is unremarkable. Vertebral heights are maintained. Exaggerated thoracic kyphosis IMPRESSION: Emphysematous changes without a focal airspace opacity. Electronically Signed   By: Lorenza Cambridge M.D.   On: 06/30/2022 15:11    Review of Systems  Constitutional:  Positive for fatigue. Negative for fever.  HENT:  Positive for sore throat.   Eyes:  Negative for discharge.  Respiratory:  Positive for chest tightness and shortness of breath. Negative for wheezing.   Cardiovascular:  Positive for chest pain. Negative for palpitations and leg swelling.  Gastrointestinal:  Negative for abdominal distention.  Genitourinary:  Negative for difficulty urinating.  Neurological:  Negative for dizziness and seizures.   Blood pressure (!) 170/72, pulse (!) 51, temperature 98.1 F (36.7 C), temperature source Oral, resp. rate 18, weight 35.6 kg, SpO2 100 %. Physical Exam HENT:     Head: Normocephalic and atraumatic.  Eyes:     Pupils: Pupils are equal, round, and reactive to light.  Neck:     Vascular: JVD present.  Cardiovascular:     Rate and Rhythm: Bradycardia present.     Comments:  Bradycardic S1-S2 normal 2/6 systolic murmur and soft S3 gallop noted Pulmonary:     Comments: Decreased breath sounds at bases with bilateral faint rales noted Musculoskeletal:     Comments: No clubbing cyanosis or edema  Skin:    General: Skin is warm and dry.  Neurological:     General: No focal deficit present.     Mental Status: She is alert.     Assessment/Plan: New onset angina with abnormal EKG rule out MI rule out coronary insufficiency Mild decompensated congestive heart failure Hypertensive heart disease Probable interstitial lung disease Hyperlipidemia Osteoporosis Plan Agree with present management Check serial enzymes and EKG Add low-dose nitrates Will schedule for nuclear stress test in a.m. Discussed with her daughter at length regarding various options of  treatment i.e. medication versus noninvasive stress testing versus cardiac catheterization and agrees for noninvasive stress testing and if shows large area of ischemia may consider invasive cardiac catheterization and possible PCI Check 2D echo if not done recently Check old records Rinaldo Cloud 06/30/2022, 7:13 PM

## 2022-06-30 NOTE — ED Provider Notes (Signed)
[place] EMERGENCY DEPARTMENT AT [facility] Provider Note   HPI: Heidi Spence is an 82 year old female with a past medical history as below notable for CHF, interstitial lung disease presenting today with shortness of breath and chest pain.  She is here with her daughter who provides much of the history.  She reports over the last few months she has had worsening shortness of breath.  She also endorses a pressure in her chest that is constant.  It does not get worse with exertion but does seem to improve with rest.  She gets severely dyspneic with walking 10-15 steps.  She has an inhaler that she uses however it has not helped.  She was recently seen at [facility] and was given follow-up with her PCP next Wednesday and pulmonology next Thursday.  She endorses feeling the chest pressure at present.  Due to worsening of her symptoms she called her her PCP recommended going to urgent care.  Urgent care saw the patient and recommended she go to the emergency department.  Past Medical History:  Diagnosis Date   CHF (congestive heart failure) (HCC)    HLD (hyperlipidemia)    Hypertension    Interstitial lung disease (HCC)     No past surgical history on file.   Social History   Tobacco Use   Smoking status: Never  Substance Use Topics   Alcohol use: No   Drug use: No      Review of Systems  A complete ROS was performed with pertinent positives/negatives noted in the HPI.   Vitals:   06/30/22 1330 06/30/22 1554  BP: (!) 155/68 (!) 170/72  Pulse: (!) 55 (!) 51  Resp: 18 18  Temp: 98.2 F (36.8 C) 98.1 F (36.7 C)  SpO2: 97% 100%    Physical Exam Vitals and nursing note reviewed.  Constitutional:      General: She is not in acute distress.    Appearance: She is well-developed.  HENT:     Head: Normocephalic and atraumatic.  Cardiovascular:     Rate and Rhythm: Normal rate and regular rhythm.     Pulses: Normal pulses.          Radial pulses are 2+ on the  right side and 2+ on the left side.       Dorsalis pedis pulses are 2+ on the right side and 2+ on the left side.     Heart sounds: Normal heart sounds. No murmur heard.    No friction rub. No gallop.  Pulmonary:     Effort: Pulmonary effort is normal. No respiratory distress.     Breath sounds: Normal breath sounds. No stridor. No wheezing, rhonchi or rales.  Abdominal:     General: Abdomen is flat. There is no distension.     Palpations: Abdomen is soft.     Tenderness: There is no abdominal tenderness. There is no guarding or rebound.  Musculoskeletal:        General: No swelling.     Cervical back: Neck supple.  Skin:    General: Skin is warm and dry.     Capillary Refill: Capillary refill takes less than 2 seconds.  Neurological:     Mental Status: She is alert.  Psychiatric:        Mood and Affect: Mood normal.     Procedures  MDM:  Imaging/radiology results:  Results for orders placed or performed during the hospital encounter of 06/30/22 (from the past 24 hour(s))  Basic metabolic panel     Status: Abnormal   Collection Time: 06/30/22  1:50 PM  Result Value Ref Range   Sodium 136 135 - 145 mmol/L   Potassium 3.8 3.5 - 5.1 mmol/L   Chloride 101 98 - 111 mmol/L   CO2 25 22 - 32 mmol/L   Glucose, Bld 86 70 - 99 mg/dL   BUN 27 (H) 8 - 23 mg/dL   Creatinine, Ser 8.29 (H) 0.44 - 1.00 mg/dL   Calcium 9.2 8.9 - 56.2 mg/dL   GFR, Estimated 46 (L) >60 mL/min   Anion gap 10 5 - 15  CBC     Status: None   Collection Time: 06/30/22  1:50 PM  Result Value Ref Range   WBC 7.4 4.0 - 10.5 K/uL   RBC 4.36 3.87 - 5.11 MIL/uL   Hemoglobin 12.8 12.0 - 15.0 g/dL   HCT 13.0 86.5 - 78.4 %   MCV 92.0 80.0 - 100.0 fL   MCH 29.4 26.0 - 34.0 pg   MCHC 31.9 30.0 - 36.0 g/dL   RDW 69.6 29.5 - 28.4 %   Platelets 272 150 - 400 K/uL   nRBC 0.0 0.0 - 0.2 %  Troponin I (High Sensitivity)     Status: None   Collection Time: 06/30/22  1:50 PM  Result Value Ref Range   Troponin I (High  Sensitivity) 16 <18 ng/L  Troponin I (High Sensitivity)     Status: Abnormal   Collection Time: 06/30/22  4:22 PM  Result Value Ref Range   Troponin I (High Sensitivity) 19 (H) <18 ng/L  Brain natriuretic peptide     Status: Abnormal   Collection Time: 06/30/22  4:22 PM  Result Value Ref Range   B Natriuretic Peptide 261.7 (H) 0.0 - 100.0 pg/mL     EKG results: ECG on my interpretation is normal sinus rhythm and rate, without anatomical ischemia representing STEMI, New-onset Arrhythmia, or ischemic equivalent. She has TWI in the inferior leads and V4-V6.   Lab results:  Labs Reviewed  BASIC METABOLIC PANEL - Abnormal; Notable for the following components:      Result Value   BUN 27 (*)    Creatinine, Ser 1.19 (*)    GFR, Estimated 46 (*)    All other components within normal limits  BRAIN NATRIURETIC PEPTIDE - Abnormal; Notable for the following components:   B Natriuretic Peptide 261.7 (*)    All other components within normal limits  TROPONIN I (HIGH SENSITIVITY) - Abnormal; Notable for the following components:   Troponin I (High Sensitivity) 19 (*)    All other components within normal limits  CBC  TROPONIN I (HIGH SENSITIVITY)    Key medications administered in the ER:  Medications  methylPREDNISolone sodium succinate (SOLU-MEDROL) 125 mg/2 mL injection 125 mg (125 mg Intravenous Given 06/30/22 1619)   Consults: Cardiology  Medical decision making: -Vital signs stable. Patient afebrile, hemodynamically stable, and non-toxic appearing. -Patient's presentation is most consistent with acute presentation with potential threat to life or bodily function.. Garrick Klem is a 82 y.o. female presenting to the emergency department with shortness of breath.  -Additional history obtained from patient's daughter at bedside. -Per chart review, CT without contrast on 6/6 S24 demonstrated bilateral bronchiectasis with scattered areas of mucous plugging and numerous pulmonary nodules  unchanged from prior CT. -EKG I obtained reveals no anatomical ischemia representing STEMI, New-onset Arrhythmia, or ischemic equivalent however does show T wave inversions, unclear if these are new or  old as there are no baseline for comparison.  Initial troponin normal at 16 with repeat troponin slightly elevated 19.  Delta troponin of 3.  Given flat delta troponin lower concern for ACS however heart score is elevated at 8 and believe she needs additional cardiac workup in the setting of her exertional dyspnea, will proceed with cardiology consult and admission. No concerns for Pericardial Tamponade on EKG and in light of patients hemodynamic stability doubt this pathology. No pain related to supine or prone positions and given EKG doubt Pericarditis. CXR unremarkable for focal airspace disease, patient is afebrile, no cough, and WBC shows no leukocytosis, do not suspect Pneumonia. CXR without evidence of Pneumothorax. CXR without concern for Esophageal Tear and there is no recent intractable emesis or esophageal instrumentation. No peritonitis or free air on CXR worrisome for Perforated Abdominal Viscous. Unlikely myocarditis, does not fit clinical picture, chest pain not exertional, no EKG findings to support. Unlikely Pulmonary Embolism as well's score, low probability with presentation. Therefore will not obtain CTA Chest or D-dimer. Pain is not described as tearing and does not radiate to back, no pulse deficit, no neurologic complaints. CXR does not show widened mediastinum. Doubt Aortic Dissection.  Labs also show mild creatinine bump up from baseline from 0.7 to 1.19 today.  Will presentation could be consistent with her chronic interstitial lung disease, she has multiple cardiac risk factors and I believe admission is indicated for further cardiac workup.  Cardiac consulted with recommendations pending.  I discussed patient with medicine who will admit her to their service.    Medical Decision  Making Amount and/or Complexity of Data Reviewed Labs: ordered. Decision-making details documented in ED Course. Radiology: ordered and independent interpretation performed. Decision-making details documented in ED Course. ECG/medicine tests: ordered and independent interpretation performed. Decision-making details documented in ED Course.  Risk Prescription drug management. Decision regarding hospitalization.     The plan for this patient was discussed with Dr. Silverio Lay, who voiced agreement and who oversaw evaluation and treatment of this patient.  Marta Lamas, MD Emergency Medicine, PGY-3  Note: Dragon medical dictation software was used in the creation of this note.   Clinical Impression:  1. Dyspnea on exertion          Chase Caller, MD 06/30/22 1924    Charlynne Pander, MD 07/01/22 [id number]

## 2022-06-30 NOTE — ED Notes (Signed)
Urine sample sent to lab

## 2022-06-30 NOTE — H&P (Signed)
History and Physical    PatientBraidyn Spence GNF:[id number] DOB: [birth year] DOA: 06/30/2022 DOS: the patient was seen and examined on 06/30/2022 PCP: Pcp, No  Patient coming from: Home  Chief Complaint:  Chief Complaint  Patient presents with   Chest Pain   Shortness of Breath   HPI: Heidi Spence is a 82 y.o. female with medical history significant of interstitial lung disease, essential hypertension, diastolic CHF, hyperlipidemia, who presents to the ER with exertional dyspnea.  Patient was first sent to the urgent care center for evaluation of central chest pain dizziness and shortness of breath which has been going on and off for almost a year but got worse this Friday.  She was seen in the [facility] for the same.  Patient was scheduled for an appointment on Wednesday but then was found to have abnormal EKG.  Sent to the ER for further evaluation.  In the ER she described her symptoms are more exertional.  Associated with some shortness of breath.  She uses her inhaler with some relief.  Patient finds it difficult to take a deep breath.  EKG showed inferolateral T wave inversions.  No LVH EKG to compare.  Cardiology was consulted and recommends admission for further evaluation.  Patient has not had any stress test in a long time.  Initial troponin was 16-second troponin 19.  Patient will be admitted for observation and MRI rule out  Review of Systems: As mentioned in the history of present illness. All other systems reviewed and are negative. Past Medical History:  Diagnosis Date   CHF (congestive heart failure) (HCC)    HLD (hyperlipidemia)    Hypertension    Interstitial lung disease (HCC)    No past surgical history on file. Social History:  reports that she has never smoked. She does not have any smokeless tobacco history on file. She reports that she does not drink alcohol and does not use drugs.  No Known Allergies  No family history on file.  Prior to Admission  medications   Medication Sig Start Date End Date Taking? Authorizing Provider  fluticasone (FLONASE) 50 MCG/ACT nasal spray Place 1-2 sprays into both nostrils daily for 7 days. 12/09/18 12/16/18  Wieters, Hallie C, PA-C  HYDROcodone-acetaminophen (NORCO) 5-325 MG per tablet Take 1-2 tablets by mouth every 6 (six) hours as needed. 11/09/13   Geoffery Lyons, MD    Physical Exam: Vitals:   06/30/22 1330 06/30/22 1336 06/30/22 1554  BP: (!) 155/68  (!) 170/72  Pulse: (!) 55  (!) 51  Resp: 18  18  Temp: 98.2 F (36.8 C)  98.1 F (36.7 C)  TempSrc:   Oral  SpO2: 97%  100%  Weight:  35.6 kg    Constitutional: Acutely ill looking no distress NAD, calm, comfortable Eyes: PERRL, lids and conjunctivae normal ENMT: Mucous membranes are moist. Posterior pharynx clear of any exudate or lesions.Normal dentition.  Neck: normal, supple, no masses, no thyromegaly Respiratory: clear to auscultation bilaterally, no wheezing, no crackles. Normal respiratory effort. No accessory muscle use.  Cardiovascular: Sinus bradycardia, no murmurs / rubs / gallops. No extremity edema. 2+ pedal pulses. No carotid bruits.  Abdomen: no tenderness, no masses palpated. No hepatosplenomegaly. Bowel sounds positive.  Musculoskeletal: Good range of motion, no joint swelling or tenderness, Skin: no rashes, lesions, ulcers. No induration Neurologic: CN 2-12 grossly intact. Sensation intact, DTR normal. Strength 5/5 in all 4.  Psychiatric: Normal judgment and insight. Alert and oriented x 3. Normal mood  Data Reviewed:  Initial troponin 16 subsequently 19, EKG showed sinus bradycardia with left bundle branch block and inferolateral T wave inversions.  Creatinine is 1.19.  BNP 261.7.  Chest x-ray showed emphysematous changes no infiltrate.  Assessment and Plan:  #1 exertional dyspnea: Suspected cardiac equivalent.  Could also be due to her interstitial lung disease versus CHF.  With the chest pressure the patient has been  having on and off will admit the patient for MI rule out.  EKG EKG is abnormal and no old ones to compare.  Initial troponin also slightly changed but only a delta of 3.  Cardiology consulted.  Cycle enzymes.  Lovenox and beta-blocker.  Consider cardiac stress testing in the morning if okay with cardiology.  #2 essential hypertension: We will confirm and resume home regimen.  #3 interstitial lung disease: Stable.  Continue with home regimen.  #4 history of diastolic CHF: Compensated.  No fluid overload on chest x-ray.  May consider another echocardiogram.  #5 hyperlipidemia: Will likely need statin.    Advance Care Planning:   Code Status: Full Code   Consults: Cardiology  Family Communication: No family at bedside  Severity of Illness: The appropriate patient status for this patient is OBSERVATION. Observation status is judged to be reasonable and necessary in order to provide the required intensity of service to ensure the patient's safety. The patient's presenting symptoms, physical exam findings, and initial radiographic and laboratory data in the context of their medical condition is felt to place them at decreased risk for further clinical deterioration. Furthermore, it is anticipated that the patient will be medically stable for discharge from the hospital within 2 midnights of admission.   AuthorLonia Blood, MD 06/30/2022 6:27 PM  For on call review [email or web address].

## 2022-06-30 NOTE — ED Triage Notes (Addendum)
Pt speaks laotian; Pt sent by urgent care for evaluation of central cp, dizziness, and sob intermittently for 1 year, worsening; seen last Friday at [facility] for same; scheduled for follow up appointment on Wednesday; reports abnormal EKG; cp described as tightness; sob worse with exertion and in the morning; using inhaler at home with some relief, still states she cannot take a deep breath; hx CHF and interstitial lung disease; denies swelling

## 2022-07-01 ENCOUNTER — Observation Stay (HOSPITAL_BASED_OUTPATIENT_CLINIC_OR_DEPARTMENT_OTHER): Payer: 59

## 2022-07-01 DIAGNOSIS — R008 Other abnormalities of heart beat: Secondary | ICD-10-CM | POA: Diagnosis not present

## 2022-07-01 DIAGNOSIS — R0609 Other forms of dyspnea: Secondary | ICD-10-CM | POA: Diagnosis not present

## 2022-07-01 DIAGNOSIS — R0600 Dyspnea, unspecified: Secondary | ICD-10-CM | POA: Diagnosis not present

## 2022-07-01 LAB — CBC
HCT: 36.3 % (ref 36.0–46.0)
Hemoglobin: 12 g/dL (ref 12.0–15.0)
MCH: 29.9 pg (ref 26.0–34.0)
MCHC: 33.1 g/dL (ref 30.0–36.0)
MCV: 90.3 fL (ref 80.0–100.0)
Platelets: 251 10*3/uL (ref 150–400)
RBC: 4.02 MIL/uL (ref 3.87–5.11)
RDW: 13.1 % (ref 11.5–15.5)
WBC: 5.9 10*3/uL (ref 4.0–10.5)
nRBC: 0 % (ref 0.0–0.2)

## 2022-07-01 LAB — ECHOCARDIOGRAM COMPLETE
Area-P 1/2: 2.91 cm2
Height: 60 in
S' Lateral: 2.1 cm
Weight: 1231.05 oz

## 2022-07-01 LAB — LIPID PANEL
Cholesterol: 144 mg/dL (ref 0–200)
HDL: 66 mg/dL (ref >40)
LDL Cholesterol: 73 mg/dL (ref 0–99)
Total CHOL/HDL Ratio: 2.2 RATIO
Triglycerides: 25 mg/dL (ref <150)
VLDL: 5 mg/dL (ref 0–40)

## 2022-07-01 MED ORDER — ROSUVASTATIN CALCIUM 5 MG PO TABS
5.0000 mg | ORAL_TABLET | Freq: Every day | ORAL | Status: DC
  Administered 2022-07-01 – 2022-07-03 (×3): 5 mg via ORAL
  Filled 2022-07-01 (×3): qty 1

## 2022-07-01 MED ORDER — POLYETHYLENE GLYCOL 3350 17 G PO PACK
17.0000 g | PACK | Freq: Every day | ORAL | Status: DC
  Administered 2022-07-01: 17 g via ORAL
  Filled 2022-07-01 (×3): qty 1

## 2022-07-01 MED ORDER — ASPIRIN 81 MG PO TBEC
81.0000 mg | DELAYED_RELEASE_TABLET | Freq: Every day | ORAL | Status: DC
  Administered 2022-07-01 – 2022-07-03 (×3): 81 mg via ORAL
  Filled 2022-07-01 (×3): qty 1

## 2022-07-01 NOTE — Care Management Obs Status (Signed)
MEDICARE OBSERVATION STATUS NOTIFICATION   Patient Details  Name: Heidi Spence MRN: [id number] Date of Birth: 03-23-1940   Medicare Observation Status Notification Given:  Yes    Tom-Johnson, Hershal Coria, RN 07/01/2022, 4:22 PM

## 2022-07-01 NOTE — Progress Notes (Signed)
  Echocardiogram 2D Echocardiogram has been performed.  Janalyn Harder 07/01/2022, 8:57 AM

## 2022-07-01 NOTE — Progress Notes (Signed)
Subjective:  Patient denies any chest pain or short breath.  States ate breakfast earlier this morning  Objective:  Vital Signs in the last 24 hours: Temp:  [97.8 F (36.6 C)-98.2 F (36.8 C)] 98.1 F (36.7 C) (06/15 0732) Pulse Rate:  [51-62] 62 (06/15 0732) Resp:  [17-20] 17 (06/15 0732) BP: (122-170)/(55-72) 124/55 (06/15 0732) SpO2:  [95 %-100 %] 100 % (06/15 0732) Weight:  [34.9 kg-35.6 kg] 34.9 kg (06/15 0425)  Intake/Output from previous day: 06/14 0701 - 06/15 0700 In: -  Out: 900 [Urine:900] Intake/Output from this shift: No intake/output data recorded.  Physical Exam: Neck: no adenopathy, no carotid bruit, no JVD, and supple, symmetrical, trachea midline Lungs: clear to auscultation bilaterally Heart: regular rate and rhythm, S1, S2 normal, and 2/6 systolic murmur noted Abdomen: soft, non-tender; bowel sounds normal; no masses,  no organomegaly Extremities: extremities normal, atraumatic, no cyanosis or edema  Lab Results: Recent Labs    06/30/22 2029 07/01/22 0054  WBC 7.0 5.9  HGB 12.7 12.0  PLT 226 251   Recent Labs    06/30/22 1350 06/30/22 2029  NA 136  --   K 3.8  --   CL 101  --   CO2 25  --   GLUCOSE 86  --   BUN 27*  --   CREATININE 1.19* 1.19*   No results for input(s): "TROPONINI" in the last 72 hours.  Invalid input(s): "CK", "MB" Hepatic Function Panel No results for input(s): "PROT", "ALBUMIN", "AST", "ALT", "ALKPHOS", "BILITOT", "BILIDIR", "IBILI" in the last 72 hours. Recent Labs    07/01/22 0054  CHOL 144   No results for input(s): "PROTIME" in the last 72 hours.  Imaging: Imaging results have been reviewed and DG Chest 2 View  Result Date: 06/30/2022 CLINICAL DATA:  cp sob EXAM: CHEST - 2 VIEW COMPARISON:  CXR 06/22/22 FINDINGS: Pulmonary hyperinflation. No pleural effusion. No pneumothorax. Unchanged cardiac and mediastinal contours. Emphysematous changes are redemonstrated. No focal airspace opacity. No radiographically  apparent displaced rib fractures. Visualized upper abdomen is unremarkable. Vertebral heights are maintained. Exaggerated thoracic kyphosis IMPRESSION: Emphysematous changes without a focal airspace opacity. Electronically Signed   By: Lorenza Cambridge M.D.   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

## 2022-07-01 NOTE — Progress Notes (Signed)
Chaplain responded to Spiritual Consult for HCPOA/AD paperwork.  Chaplain was advised by pt's two sons the Advocate would be their sister who won't be here until 5pm. Chaplain left pager phone number with sons to pass on to pt's daughter so she can call. Chaplain will return and explain paperwork.  Vernell Morgans Chaplain

## 2022-07-01 NOTE — Progress Notes (Signed)
PROGRESS NOTE    Heidi Spence  ZOX:[id number] DOB: 08-23-1940 DOA: 06/30/2022 PCP: Pcp, No  Chief Complaint  Patient presents with   Chest Pain   Shortness of Breath    Brief Narrative:   Heidi Spence is Heidi Spence Laotian speaking 82 y.o. female with medical history significant of interstitial lung disease, essential hypertension, diastolic CHF, hyperlipidemia, who presents to the ER with exertional dyspnea.   Assessment & Plan:   Principal Problem:   Exertional dyspnea  #1 Shortness of Breath with Exertion:  progressive.  She was gardening about 1 year ago, now SOB just walking around the house.  Currently being evaluated by cardiology, T wave inversions on EKG (II, III, aVF, IV, V, VI), troponins only minimally elevated.   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  Appreciate pulmonary assistance.    #4 history of diastolic CHF: Compensated.  No fluid overload on chest x-ray.  May consider another echocardiogram.   #5 hyperlipidemia: atorvastatin  Med rec doesn't appear complete, will follow with pharmacy    DVT prophylaxis: heparin Code Status: full Family Communication: daughter Disposition:   Status is: Observation The patient will require care spanning > 2 midnights and should be moved to inpatient because: awaiting stress test   Consultants:  cardiology  Procedures:  Echo IMPRESSIONS     1. Left ventricular ejection fraction, by estimation, is 60 to  65%. The  left ventricle has normal function. The left ventricle has no regional  wall motion abnormalities. Left ventricular diastolic parameters are  consistent with Grade I diastolic  dysfunction (impaired relaxation).   2. Right ventricular systolic function is normal. The right ventricular  size is normal. There is mildly elevated pulmonary artery systolic  pressure.   3. Left atrial size was moderately dilated.   4. Right atrial size was mildly dilated.   5. The mitral valve is abnormal. Mild mitral valve regurgitation. No  evidence of mitral stenosis.   6. Calcified non coronary cusp. The aortic valve is tricuspid. There is  mild calcification of the aortic valve. Aortic valve regurgitation is not  visualized. Aortic valve sclerosis is present, with no evidence of aortic  valve stenosis.   7. The inferior vena cava is normal in size with greater than 50%  respiratory variability, suggesting right atrial pressure of 3 mmHg.   Antimicrobials:  Anti-infectives (From admission, onward)    None       Subjective: No CP at this time Daughter comfortable translating, she assisted with discussion as patient [place] speaking   Objective: Vitals:   07/01/22 0012 07/01/22 0425 07/01/22 0732 07/01/22 1122  BP: 132/66 (!) 122/58 (!) 124/55 (!) 143/65  Pulse: (!) 53 (!) 51 62 (!) 57  Resp: 19 20 17 17   Temp: 98.1 F (36.7 C) 98 F (36.7 C) 98.1 F (36.7 C) 97.9 F (36.6 C)  TempSrc: Oral Oral Oral Oral  SpO2: 96% 97% 100% 98%  Weight:  34.9 kg    Height:        Intake/Output Summary (Last 24  hours) at 07/01/2022 1247 Last data filed at 07/01/2022 0700 Gross per 24 hour  Intake --  Output 900 ml  Net -900 ml   Filed Weights   06/30/22 1336 06/30/22 2000 07/01/22 0425  Weight: 35.6 kg 34.9 kg 34.9 kg    Examination:  General exam: Appears calm and comfortable  Respiratory system: bibasilar crackles Cardiovascular system: S1 & S2 heard, RRR.  Gastrointestinal system:  Abdomen is nondistended, soft and nontender. Central nervous system: Alert and oriented. No focal neurological deficits. Extremities: no LEE   Data Reviewed: I have personally reviewed following labs and imaging studies  CBC: Recent Labs  Lab 06/30/22 1350 06/30/22 2029 07/01/22 0054  WBC 7.4 7.0 5.9  HGB 12.8 12.7 12.0  HCT 40.1 38.8 36.3  MCV 92.0 90.4 90.3  PLT 272 226 251    Basic Metabolic Panel: Recent Labs  Lab 06/30/22 1350 06/30/22 2029  NA 136  --   K 3.8  --   CL 101  --   CO2 25  --   GLUCOSE 86  --   BUN 27*  --   CREATININE 1.19* 1.19*  CALCIUM 9.2  --     GFR: Estimated Creatinine Clearance: 20.1 mL/min (Keshaun Dubey) (by C-G formula based on SCr of 1.19 mg/dL (H)).  Liver Function Tests: No results for input(s): "AST", "ALT", "ALKPHOS", "BILITOT", "PROT", "ALBUMIN" in the last 168 hours.  CBG: No results for input(s): "GLUCAP" in the last 168 hours.   No results found for this or any previous visit (from the past 240 hour(s)).       Radiology Studies: ECHOCARDIOGRAM COMPLETE  Result Date: 07/01/2022    ECHOCARDIOGRAM REPORT   Patient Name:   Heidi Spence Date of Exam: 07/01/2022 Medical Rec #:  [id number]   Height:       60.0 in Accession #:    [id number]  Weight:       76.9 lb Date of Birth:  03/25/1940   BSA:          1.244 m Patient Age:    81 years    BP:           122/58 mmHg Patient Gender: F           HR:           59 bpm. Exam Location:  Inpatient Procedure: 2D Echo, 3D Echo, Cardiac Doppler and Color Doppler Indications:    R00.8 Other abnormalities of heart beat  History:        Patient has no prior history of Echocardiogram examinations.                 Signs/Symptoms:Dyspnea and Shortness of Breath.  Sonographer:    Sheralyn Boatman RDCS Referring Phys: [phone] Hoang Reich CALDWELL POWELL JR  Sonographer Comments: Extremely thin habitus. Low parasternal. IMPRESSIONS  1. Left ventricular ejection fraction, by estimation, is 60 to 65%. The left ventricle has normal  function. The left ventricle has no regional wall motion abnormalities. Left ventricular diastolic parameters are consistent with Grade I diastolic dysfunction (impaired relaxation).  2. Right ventricular systolic function is normal. The right ventricular size is normal. There is mildly elevated pulmonary artery systolic pressure.  3. Left atrial size was moderately dilated.  4. Right atrial size was mildly dilated.  5. The mitral valve is abnormal. Mild mitral valve regurgitation. No evidence of mitral stenosis.  6. Calcified non coronary cusp. The aortic valve is tricuspid. There is mild calcification of the aortic valve. Aortic  valve regurgitation is not visualized. Aortic valve sclerosis is present, with no evidence of aortic valve stenosis.  7. The inferior vena cava is normal in size with greater than 50% respiratory variability, suggesting right atrial pressure of 3 mmHg. FINDINGS  Left Ventricle: Left ventricular ejection fraction, by estimation, is 60 to 65%. The left ventricle has normal function. The left ventricle has no regional wall motion abnormalities. The left ventricular internal cavity size was normal in size. There is  no left ventricular hypertrophy. Left ventricular diastolic parameters are consistent with Grade I diastolic dysfunction (impaired relaxation). Right Ventricle: The right ventricular size is normal. No increase in right ventricular wall thickness. Right ventricular systolic function is normal. There is mildly elevated pulmonary artery systolic pressure. The tricuspid regurgitant velocity is 2.68  m/s, and with an assumed right atrial pressure of 8 mmHg, the estimated right ventricular systolic pressure is 36.7 mmHg. Left Atrium: Left atrial size was moderately dilated. Right Atrium: Right atrial size was mildly dilated. Pericardium: There is no evidence of pericardial effusion. Mitral Valve: The mitral valve is abnormal. There is mild thickening of the mitral valve leaflet(s). Mild  mitral valve regurgitation. No evidence of mitral valve stenosis. Tricuspid Valve: The tricuspid valve is normal in structure. Tricuspid valve regurgitation is mild . No evidence of tricuspid stenosis. Aortic Valve: Calcified non coronary cusp. The aortic valve is tricuspid. There is mild calcification of the aortic valve. Aortic valve regurgitation is not visualized. Aortic valve sclerosis is present, with no evidence of aortic valve stenosis. Pulmonic Valve: The pulmonic valve was normal in structure. Pulmonic valve regurgitation is mild. No evidence of pulmonic stenosis. Aorta: The aortic root is normal in size and structure. Venous: The inferior vena cava is normal in size with greater than 50% respiratory variability, suggesting right atrial pressure of 3 mmHg. IAS/Shunts: The interatrial septum appears to be lipomatous. No atrial level shunt detected by color flow Doppler.  LEFT VENTRICLE PLAX 2D LVIDd:         3.90 cm   Diastology LVIDs:         2.10 cm   LV e' medial:    5.22 cm/s LV PW:         1.00 cm   LV E/e' medial:  10.2 LV IVS:        1.10 cm   LV e' lateral:   4.47 cm/s LVOT diam:     1.90 cm   LV E/e' lateral: 12.0 LV SV:         76 LV SV Index:   61 LVOT Area:     2.84 cm                           3D Volume EF:                          3D EF:        72 %                          LV EDV:       77 ml                          LV ESV:       22 ml  LV SV:        55 ml RIGHT VENTRICLE             IVC RV S prime:     18.30 cm/s  IVC diam: 2.00 cm TAPSE (M-mode): 2.4 cm LEFT ATRIUM           Index        RIGHT ATRIUM           Index LA diam:      4.40 cm 3.54 cm/m   RA Area:     12.60 cm LA Vol (A2C): 52.8 ml 42.45 ml/m  RA Volume:   27.90 ml  22.43 ml/m LA Vol (A4C): 38.1 ml 30.63 ml/m  AORTIC VALVE             PULMONIC VALVE LVOT Vmax:   115.00 cm/s PR End Diast Vel: 1.66 msec LVOT Vmean:  73.200 cm/s LVOT VTI:    0.269 m  AORTA Ao Root diam: 2.90 cm Ao Asc diam:  2.70 cm  MITRAL VALVE               TRICUSPID VALVE MV Area (PHT): 2.91 cm    TR Peak grad:   28.7 mmHg MV Decel Time: 261 msec    TR Vmax:        268.00 cm/s MV E velocity: 53.50 cm/s MV Mackensie Pilson velocity: 70.20 cm/s  SHUNTS MV E/Jaleeya Mcnelly ratio:  0.76        Systemic VTI:  0.27 m                            Systemic Diam: 1.90 cm Charlton Haws MD Electronically signed by Charlton Haws MD Signature Date/Time: 07/01/2022/10:18:20 AM    Final    DG Chest 2 View  Result Date: 06/30/2022 CLINICAL DATA:  cp sob EXAM: CHEST - 2 VIEW COMPARISON:  CXR 06/22/22 FINDINGS: Pulmonary hyperinflation. No pleural effusion. No pneumothorax. Unchanged cardiac and mediastinal contours. Emphysematous changes are redemonstrated. No focal airspace opacity. No radiographically apparent displaced rib fractures. Visualized upper abdomen is unremarkable. Vertebral heights are maintained. Exaggerated thoracic kyphosis IMPRESSION: Emphysematous changes without Nour Rodrigues focal airspace opacity. Electronically Signed   By: Lorenza Cambridge M.D.   On: 06/30/2022 15:11        Scheduled Meds:  aspirin EC  81 mg Oral Daily   heparin injection (subcutaneous)  5,000 Units Subcutaneous Q12H   metoprolol tartrate  25 mg Oral BID   nitroGLYCERIN  0.5 inch Topical Q6H   rosuvastatin  5 mg Oral Daily   Continuous Infusions:  sodium chloride 75 mL/hr at 07/01/22 0947     LOS: 0 days    Time spent: over 30 min    Lacretia Nicks, MD [facility]   To contact the attending provider between 7A-7P or the covering provider during after hours 7P-7A, please log into the web site [email or web address] and access using universal [place] password for that web site. If you do not have the password, please call the hospital operator.  07/01/2022, 12:47 PM

## 2022-07-01 NOTE — Plan of Care (Signed)
  Problem: Education: Goal: Understanding of cardiac disease, CV risk reduction, and recovery process will improve Outcome: Progressing   Problem: Activity: Goal: Ability to tolerate increased activity will improve Outcome: Progressing   Problem: Cardiac: Goal: Ability to achieve and maintain adequate cardiovascular perfusion will improve Outcome: Progressing   Problem: Health Behavior/Discharge Planning: Goal: Ability to safely manage health-related needs after discharge will improve Outcome: Progressing   Problem: Clinical Measurements: Goal: Ability to maintain clinical measurements within normal limits will improve Outcome: Progressing Goal: Will remain free from infection Outcome: Progressing Goal: Diagnostic test results will improve Outcome: Progressing Goal: Respiratory complications will improve Outcome: Progressing Goal: Cardiovascular complication will be avoided Outcome: Progressing   Problem: Activity: Goal: Risk for activity intolerance will decrease Outcome: Progressing   Problem: Nutrition: Goal: Adequate nutrition will be maintained Outcome: Progressing   Problem: Coping: Goal: Level of anxiety will decrease Outcome: Progressing   Problem: Elimination: Goal: Will not experience complications related to bowel motility Outcome: Progressing Goal: Will not experience complications related to urinary retention Outcome: Progressing   Problem: Pain Managment: Goal: General experience of comfort will improve Outcome: Progressing   Problem: Safety: Goal: Ability to remain free from injury will improve Outcome: Progressing   Problem: Skin Integrity: Goal: Risk for impaired skin integrity will decrease Outcome: Progressing

## 2022-07-02 ENCOUNTER — Encounter (HOSPITAL_COMMUNITY): Payer: Self-pay | Admitting: Internal Medicine

## 2022-07-02 DIAGNOSIS — J479 Bronchiectasis, uncomplicated: Secondary | ICD-10-CM

## 2022-07-02 DIAGNOSIS — R0600 Dyspnea, unspecified: Secondary | ICD-10-CM | POA: Diagnosis not present

## 2022-07-02 DIAGNOSIS — R0609 Other forms of dyspnea: Secondary | ICD-10-CM | POA: Diagnosis not present

## 2022-07-02 LAB — BASIC METABOLIC PANEL
Anion gap: 6 (ref 5–15)
BUN: 17 mg/dL (ref 8–23)
CO2: 25 mmol/L (ref 22–32)
Calcium: 8.4 mg/dL — ABNORMAL LOW (ref 8.9–10.3)
Chloride: 108 mmol/L (ref 98–111)
Creatinine, Ser: 0.87 mg/dL (ref 0.44–1.00)
GFR, Estimated: >60 mL/min (ref >60)
Glucose, Bld: 93 mg/dL (ref 70–99)
Potassium: 4 mmol/L (ref 3.5–5.1)
Sodium: 139 mmol/L (ref 135–145)

## 2022-07-02 LAB — BLOOD GAS, ARTERIAL
Acid-Base Excess: 2.3 mmol/L — ABNORMAL HIGH (ref 0.0–2.0)
Bicarbonate: 26.5 mmol/L (ref 20.0–28.0)
Drawn by: 6116124
O2 Saturation: 98.8 %
Patient temperature: 37
pCO2 arterial: 39 mmHg (ref 32–48)
pH, Arterial: 7.44 (ref 7.35–7.45)
pO2, Arterial: 82 mmHg — ABNORMAL LOW (ref 83–108)

## 2022-07-02 LAB — CBC
HCT: 33.2 % — ABNORMAL LOW (ref 36.0–46.0)
Hemoglobin: 10.9 g/dL — ABNORMAL LOW (ref 12.0–15.0)
MCH: 29.9 pg (ref 26.0–34.0)
MCHC: 32.8 g/dL (ref 30.0–36.0)
MCV: 91.2 fL (ref 80.0–100.0)
Platelets: 214 10*3/uL (ref 150–400)
RBC: 3.64 MIL/uL — ABNORMAL LOW (ref 3.87–5.11)
RDW: 13.2 % (ref 11.5–15.5)
WBC: 10.9 10*3/uL — ABNORMAL HIGH (ref 4.0–10.5)
nRBC: 0 % (ref 0.0–0.2)

## 2022-07-02 MED ORDER — REVEFENACIN 175 MCG/3ML IN SOLN
175.0000 ug | Freq: Every day | RESPIRATORY_TRACT | Status: DC
  Administered 2022-07-02 – 2022-07-03 (×2): 175 ug via RESPIRATORY_TRACT
  Filled 2022-07-02 (×2): qty 3

## 2022-07-02 MED ORDER — ARFORMOTEROL TARTRATE 15 MCG/2ML IN NEBU
15.0000 ug | INHALATION_SOLUTION | Freq: Two times a day (BID) | RESPIRATORY_TRACT | Status: DC
  Administered 2022-07-02 – 2022-07-03 (×3): 15 ug via RESPIRATORY_TRACT
  Filled 2022-07-02 (×4): qty 2

## 2022-07-02 NOTE — Progress Notes (Signed)
RT obtained ABG, sent sample to lab, and called to notify lab at this time.

## 2022-07-02 NOTE — Consult Note (Addendum)
NAMEKency Spence, MRN:  [id number], DOB:  1940-01-26, LOS: 0 ADMISSION DATE:  06/30/2022, CONSULTATION DATE:  07/02/22 REFERRING MD:  Dr Lacretia Nicks Hospitalist, CHIEF COMPLAINT:  dyspne  PCP Pcp, No   History of Present Illness:  82 year old p.o. female.  She does not speak [place].  History is obtained by talking to the hospitalist and also review of the medical records.  She immigrated from [place] in 1981.  It appears at baseline she does have some shortness of breath and some cough but without much sputum according to the son.  She also has bilateral hearing loss using hearing aids from [facility] as of November 2023.  She is known to have bronchiectasis Milinda Pointer has ILD but this is incorrect].  She has stage IIIa chronic kidney disease.  No past surgeries.  Known to have hyperlipidemia and hypertension.  Mid February 2024 she did have E. coli urinary tract infection.  She also had influenza B March 07, 2022 that resulted in an emergency room visit.  There is also history of diastolic heart failure with the previous admission not otherwise specified.  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  No ILD reported.  I personally visualized and agree with these findings.  She is now admitted on 06/30/2022 with chest pain dizziness and shortness of breath that has been progressive for a year but getting worse for 1 or 2 days prior to admission.  No fevers no change in sputum production.  No discolored sputum according to the son.  Seen by cardiology Dr. Sharyn Lull and stress test arranged.  Pulmonary now consulted to see if patient has ILD or any pulmonary causes for shortness of breath.  Not much is known about past history as best as I could elicit beyond what is listed particularly with pulmonary infections growing up in [place].  Past Medical  History:       No data to display          Latest Reference Range & Units 06/30/22 13:50 06/30/22 16:22 06/30/22 20:29 06/30/22 22:19  Troponin I (High Sensitivity) <18 ng/L 16 19 (H) 18 (H) 16  (H): Data is abnormally high    has a past medical history of CHF (congestive heart failure) (HCC), HLD (hyperlipidemia), Hypertension, and Interstitial lung disease (HCC).   reports that she has never smoked. She does not have any smokeless tobacco history on file.  No past surgical history on file.  Allergies  Allergen Reactions   Ciprofloxacin Rash     There is no immunization history on file for this patient.  No family history on file.   Current Facility-Administered Medications:    0.9 %  sodium chloride infusion, , Intravenous, Continuous, Garba, Mohammad L, MD, Last Rate: 50 mL/hr at 07/02/22 0835, Infusion Verify at 07/02/22 0835   acetaminophen (TYLENOL) tablet 650 mg, 650 mg, Oral, Q4H PRN, Mikeal Hawthorne, Mohammad L, MD   aspirin EC tablet 81 mg, 81 mg, Oral, Daily, Harwani, Mohan, MD, 81 mg at 07/02/22 0830   heparin injection 5,000 Units, 5,000 Units, Subcutaneous, Q12H, Earlie Lou L, MD, 5,000 Units at 07/02/22 0830   metoprolol tartrate (LOPRESSOR) tablet 25 mg, 25 mg, Oral, BID, Garba, Mohammad L, MD, 25 mg at 07/02/22 0830   morphine (PF) 2 MG/ML injection 2 mg, 2 mg, Intravenous, Q4H PRN, Rometta Emery, MD   nitroGLYCERIN (  NITROGLYN) 2 % ointment 0.5 inch, 0.5 inch, Topical, Q6H, Harwani, Mohan, MD, 0.5 inch at 07/02/22 0619   nitroGLYCERIN (NITROSTAT) SL tablet 0.4 mg, 0.4 mg, Sublingual, Q5 min PRN, Garba, Mohammad L, MD   ondansetron (ZOFRAN) injection 4 mg, 4 mg, Intravenous, Q6H PRN, Mikeal Hawthorne, Mohammad L, MD   polyethylene glycol (MIRALAX / GLYCOLAX) packet 17 g, 17 g, Oral, Daily, Zigmund Daniel., MD, 17 g at 07/01/22 2137   rosuvastatin (CRESTOR) tablet 5 mg, 5 mg, Oral, Daily, Rinaldo Cloud, MD, 5 mg at 07/02/22 0830     Significant Hospital  Events:  06/30/2022 - admit  Interim History / Subjective:   07/02/2022 - seen in bed 3E06 at [facility]  Objective   Blood pressure (!) 116/50, pulse 64, temperature 97.9 F (36.6 C), temperature source Oral, resp. rate 18, height 5' (1.524 m), weight 35.5 kg, SpO2 98 %.        Intake/Output Summary (Last 24 hours) at 07/02/2022 1100 Last data filed at 07/02/2022 0835 Gross per 24 hour  Intake 2023.44 ml  Output 750 ml  Net 1273.44 ml   Filed Weights   06/30/22 2000 07/01/22 0425 07/02/22 0507  Weight: 34.9 kg 34.9 kg 35.5 kg    Examination: General: Frail elderly female lying in bed no distress. HENT: Supple neck.  No neck nodes no elevated JVP Lungs: Barrel chested with diffuse chest muscle weakness.  No respiratory distress no wheeze.  Pulse ox 100% on room air Cardiovascular: Regular rate and rhythm Abdomen: Soft nontender no organomegaly Extremities: No cyanosis no clubbing no edema Neuro: Alert and appears to be oriented.  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  Possible etiology include childhood illnesses such as mumps and other respiratory infections.  She has hyperinflated chest based on exam and also chest x-ray and CT scan.  There is a possibility she might be a common dioxide retainer.  She DOES NOT have ILD - please remove this  P:   Do bronchiectasis workup with autoimmune, IgG, IgM and IgA levels, allergy panel alpha-1 antitrypsin and QuantiFERON gold  Start bronchodilator therapy without inhaled steroids  -Mikael Spray + Brovana -> can go on duoneb Likely need flutter vlave at dc Check ABG Overall  supportive care Consider pulmonary function test as an outpatient Consider bronchoscopy as an outpatient if there is significant symptoms and based on risk (doubt good candiate)  Remove ILD from past hx (CCM could not do it)   CCM will follow 07/03/22 or 07/04/22   Best practice (daily eval):  Per [facility]   SIGNATURE    Dr. Kalman Shan, M.D., F.C.C.P,  Pulmonary and Critical Care Medicine Staff Physician, [facility] Director - Interstitial Lung Disease  Program  Pulmonary Fibrosis [facility] [place], [place], [address]  NPI Number:  NPI #[id number]  Pager: [phone], If no answer  -> Check AMION or Try [phone] Telephone (clinical office): [phone] Telephone (research): [phone]  11:00 AM 07/02/2022   07/02/2022 11:00 AM    LABS    PULMONARY No results for input(s): "PHART", "PCO2ART", "PO2ART", "HCO3", "TCO2", "O2SAT" in the last  168 hours.  Invalid input(s): "PCO2", "PO2"  CBC Recent Labs  Lab 06/30/22 2029 07/01/22 0054 07/02/22 0042  HGB 12.7 12.0 10.9*  HCT 38.8 36.3 33.2*  WBC 7.0 5.9 10.9*  PLT 226 251 214    COAGULATION No results for input(s): "INR" in the last 168 hours.  CARDIAC  No results for input(s): "TROPONINI" in the last 168 hours. No results for input(s): "PROBNP" in the last 168 hours.  CHEMISTRY Recent Labs  Lab 06/30/22 1350 06/30/22 2029 07/02/22 0042  NA 136  --  139  K 3.8  --  4.0  CL 101  --  108  CO2 25  --  25  GLUCOSE 86  --  93  BUN 27*  --  17  CREATININE 1.19* 1.19* 0.87  CALCIUM 9.2  --  8.4*   Estimated Creatinine Clearance: 27.9 mL/min (by C-G formula based on SCr of 0.87 mg/dL).   LIVER No results for input(s): "AST", "ALT", "ALKPHOS", "BILITOT", "PROT", "ALBUMIN", "INR" in the last 168 hours.   INFECTIOUS No results for input(s): "LATICACIDVEN", "PROCALCITON" in the last 168 hours.   ENDOCRINE CBG (last 3)  No results for  input(s): "GLUCAP" in the last 72 hours.       IMAGING x48h  - image(s) personally visualized  -   highlighted in bold ECHOCARDIOGRAM COMPLETE  Result Date: 07/01/2022    ECHOCARDIOGRAM REPORT   Patient Name:   Heidi Spence Date of Exam: 07/01/2022 Medical Rec #:  [id number]   Height:       60.0 in Accession #:    [id number]  Weight:       76.9 lb Date of Birth:  1940-03-09   BSA:          1.244 m Patient Age:    81 years    BP:           122/58 mmHg Patient Gender: F           HR:           59 bpm. Exam Location:  Inpatient Procedure: 2D Echo, 3D Echo, Cardiac Doppler and Color Doppler Indications:    R00.8 Other abnormalities of heart beat  History:        Patient has no prior history of Echocardiogram examinations.                 Signs/Symptoms:Dyspnea and Shortness of Breath.  Sonographer:    Sheralyn Boatman RDCS Referring Phys: [phone] A CALDWELL POWELL JR  Sonographer Comments: Extremely thin habitus. Low parasternal. IMPRESSIONS  1. Left ventricular ejection fraction, by estimation, is 60 to 65%. The left ventricle has normal function. The left ventricle has no regional wall motion abnormalities. Left ventricular diastolic parameters are consistent with Grade I diastolic dysfunction (impaired relaxation).  2. Right ventricular systolic function is normal. The right ventricular size is normal. There is mildly elevated pulmonary artery systolic pressure.  3. Left atrial size was moderately dilated.  4. Right atrial size was mildly dilated.  5. The mitral valve is abnormal. Mild mitral valve regurgitation. No evidence of mitral stenosis.  6. Calcified non coronary cusp. The aortic valve is tricuspid. There is mild calcification of the aortic valve. Aortic valve regurgitation is not visualized. Aortic valve sclerosis is present, with no evidence of aortic valve stenosis.  7. The inferior vena cava is normal in size with greater than 50% respiratory variability, suggesting right atrial pressure of 3 mmHg.  FINDINGS  Left Ventricle: Left ventricular ejection  fraction, by estimation, is 60 to 65%. The left ventricle has normal function. The left ventricle has no regional wall motion abnormalities. The left ventricular internal cavity size was normal in size. There is  no left ventricular hypertrophy. Left ventricular diastolic parameters are consistent with Grade I diastolic dysfunction (impaired relaxation). Right Ventricle: The right ventricular size is normal. No increase in right ventricular wall thickness. Right ventricular systolic function is normal. There is mildly elevated pulmonary artery systolic pressure. The tricuspid regurgitant velocity is 2.68  m/s, and with an assumed right atrial pressure of 8 mmHg, the estimated right ventricular systolic pressure is 36.7 mmHg. Left Atrium: Left atrial size was moderately dilated. Right Atrium: Right atrial size was mildly dilated. Pericardium: There is no evidence of pericardial effusion. Mitral Valve: The mitral valve is abnormal. There is mild thickening of the mitral valve leaflet(s). Mild mitral valve regurgitation. No evidence of mitral valve stenosis. Tricuspid Valve: The tricuspid valve is normal in structure. Tricuspid valve regurgitation is mild . No evidence of tricuspid stenosis. Aortic Valve: Calcified non coronary cusp. The aortic valve is tricuspid. There is mild calcification of the aortic valve. Aortic valve regurgitation is not visualized. Aortic valve sclerosis is present, with no evidence of aortic valve stenosis. Pulmonic Valve: The pulmonic valve was normal in structure. Pulmonic valve regurgitation is mild. No evidence of pulmonic stenosis. Aorta: The aortic root is normal in size and structure. Venous: The inferior vena cava is normal in size with greater than 50% respiratory variability, suggesting right atrial pressure of 3 mmHg. IAS/Shunts: The interatrial septum appears to be lipomatous. No atrial level shunt detected by color flow Doppler.   LEFT VENTRICLE PLAX 2D LVIDd:         3.90 cm   Diastology LVIDs:         2.10 cm   LV e' medial:    5.22 cm/s LV PW:         1.00 cm   LV E/e' medial:  10.2 LV IVS:        1.10 cm   LV e' lateral:   4.47 cm/s LVOT diam:     1.90 cm   LV E/e' lateral: 12.0 LV SV:         76 LV SV Index:   61 LVOT Area:     2.84 cm                           3D Volume EF:                          3D EF:        72 %                          LV EDV:       77 ml                          LV ESV:       22 ml                          LV SV:        55 ml RIGHT VENTRICLE             IVC RV S prime:     18.30 cm/s  IVC diam:  2.00 cm TAPSE (M-mode): 2.4 cm LEFT ATRIUM           Index        RIGHT ATRIUM           Index LA diam:      4.40 cm 3.54 cm/m   RA Area:     12.60 cm LA Vol (A2C): 52.8 ml 42.45 ml/m  RA Volume:   27.90 ml  22.43 ml/m LA Vol (A4C): 38.1 ml 30.63 ml/m  AORTIC VALVE             PULMONIC VALVE LVOT Vmax:   115.00 cm/s PR End Diast Vel: 1.66 msec LVOT Vmean:  73.200 cm/s LVOT VTI:    0.269 m  AORTA Ao Root diam: 2.90 cm Ao Asc diam:  2.70 cm MITRAL VALVE               TRICUSPID VALVE MV Area (PHT): 2.91 cm    TR Peak grad:   28.7 mmHg MV Decel Time: 261 msec    TR Vmax:        268.00 cm/s MV E velocity: 53.50 cm/s MV A velocity: 70.20 cm/s  SHUNTS MV E/A ratio:  0.76        Systemic VTI:  0.27 m                            Systemic Diam: 1.90 cm Charlton Haws MD Electronically signed by Charlton Haws MD Signature Date/Time: 07/01/2022/10:18:20 AM    Final    DG Chest 2 View  Result Date: 06/30/2022 CLINICAL DATA:  cp sob EXAM: CHEST - 2 VIEW COMPARISON:  CXR 06/22/22 FINDINGS: Pulmonary hyperinflation. No pleural effusion. No pneumothorax. Unchanged cardiac and mediastinal contours. Emphysematous changes are redemonstrated. No focal airspace opacity. No radiographically apparent displaced rib fractures. Visualized upper abdomen is unremarkable. Vertebral heights are maintained. Exaggerated thoracic kyphosis IMPRESSION:  Emphysematous changes without a focal airspace opacity. Electronically Signed   By: Lorenza Cambridge M.D.   On: 06/30/2022 15:11

## 2022-07-02 NOTE — Progress Notes (Signed)
PROGRESS NOTE    Heidi Spence  JXB:[id number] DOB: 01-06-1941 DOA: 06/30/2022 PCP: Pcp, No  Chief Complaint  Patient presents with   Chest Pain   Shortness of Breath    Brief Narrative:   [name] is [name] Laotian speaking 82 y.o. female with medical history significant of interstitial lung disease, essential hypertension, diastolic CHF, hyperlipidemia, who presents to the ER with exertional dyspnea.   Assessment & Plan:   Principal Problem:   Exertional dyspnea  #1 Shortness of Breath with Exertion:  progressive.  She was gardening about 1 year ago, now SOB just walking around the house.  Currently being evaluated by cardiology, T wave inversions on EKG (II, III, aVF, IV, V, VI), troponins only minimally elevated.   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  Appreciate pulmonary assistance.    #4 history of diastolic CHF: Compensated.  No fluid overload on chest x-ray.  Echo as above.   #5 hyperlipidemia: atorvastatin  Med rec doesn't appear complete, will follow with pharmacy    DVT prophylaxis: heparin Code Status: full Family Communication: son  Disposition:   Status is: Observation The patient will require care spanning > 2 midnights and should be moved to inpatient because: awaiting stress test   Consultants:  cardiology  Procedures:  Echo IMPRESSIONS     1. Left ventricular ejection fraction, by estimation, is 60 to 65%. The  left ventricle  has normal function. The left ventricle has no regional  wall motion abnormalities. Left ventricular diastolic parameters are  consistent with Grade I diastolic  dysfunction (impaired relaxation).   2. Right ventricular systolic function is normal. The right ventricular  size is normal. There is mildly elevated pulmonary artery systolic  pressure.   3. Left atrial size was moderately dilated.   4. Right atrial size was mildly dilated.   5. The mitral valve is abnormal. Mild mitral valve regurgitation. No  evidence of mitral stenosis.   6. Calcified non coronary cusp. The aortic valve is tricuspid. There is  mild calcification of the aortic valve. Aortic valve regurgitation is not  visualized. Aortic valve sclerosis is present, with no evidence of aortic  valve stenosis.   7. The inferior vena cava is normal in size with greater than 50%  respiratory variability, suggesting right atrial pressure of 3 mmHg.   Antimicrobials:  Anti-infectives (From admission, onward)    None       Subjective: Son at bedside, his [place] is limited I called daughter, she didn't answer No Laotian interpreter available when I called the telephone interpreter line Her son seemed to understand plan for stress test tomorrow and for lung doctors to see  Objective: Vitals:   07/01/22 1949 07/02/22 0016 07/02/22 0507 07/02/22 0733  BP: (!) 147/75 (!) 144/64 (!) 134/90 (!) 116/50  Pulse: (!) 57   64  Resp: 16 18 18 18   Temp: 97.7 F (36.5 C)  97.8 F (36.6 C) 97.9 F (36.6 C)  TempSrc: Oral  Oral Oral  SpO2: 100%  97% 98%  Weight:   35.5 kg   Height:        Intake/Output Summary (Last 24 hours) at 07/02/2022 0939 Last data filed at 07/02/2022 0835 Gross per 24 hour  Intake 2023.44 ml  Output 750 ml  Net 1273.44 ml   Filed Weights   06/30/22 2000 07/01/22 0425 07/02/22 0507  Weight: 34.9 kg 34.9 kg 35.5 kg    Examination:  General: No acute distress. Lungs: unlabored Neurological:  Alert and oriented 3. Moves all extremities 4 with equal strength. Cranial nerves II through XII grossly intact. Extremities: No clubbing or cyanosis. No edema.   Data Reviewed: I have personally reviewed following labs and imaging studies  CBC: Recent Labs  Lab 06/30/22 1350 06/30/22 2029 07/01/22 0054 07/02/22 0042  WBC 7.4 7.0 5.9 10.9*  HGB 12.8 12.7 12.0 10.9*  HCT 40.1 38.8 36.3 33.2*  MCV 92.0 90.4 90.3 91.2  PLT 272 226 251 214    Basic Metabolic Panel: Recent Labs  Lab 06/30/22 1350 06/30/22 2029 07/02/22 0042  NA 136  --  139  K 3.8  --  4.0  CL 101  --  108  CO2 25  --  25  GLUCOSE 86  --  93  BUN 27*  --  17  CREATININE 1.19* 1.19* 0.87  CALCIUM 9.2  --  8.4*    GFR: Estimated Creatinine Clearance: 27.9 mL/min (by C-G formula based on SCr of 0.87 mg/dL).  Liver Function Tests: No results for input(s): "AST", "ALT", "ALKPHOS", "BILITOT", "PROT", "ALBUMIN" in the last 168 hours.  CBG: No results for input(s): "GLUCAP" in the last 168 hours.   No results found for this or any previous visit (from the past 240 hour(s)).       Radiology Studies: ECHOCARDIOGRAM COMPLETE  Result Date: 07/01/2022    ECHOCARDIOGRAM REPORT   Patient Name:   Heidi Spence Date of Exam: 07/01/2022 Medical Rec #:  [id number]   Height:       60.0 in Accession #:    [id number]  Weight:       76.9 lb Date of Birth:  1940-05-07   BSA:          1.244 m Patient Age:    81 years    BP:           122/58 mmHg Patient Gender: F           HR:           59 bpm. Exam Location:  Inpatient Procedure: 2D Echo, 3D Echo, Cardiac Doppler and Color Doppler Indications:    R00.8 Other abnormalities of heart beat  History:        Patient has no prior history of Echocardiogram examinations.                 Signs/Symptoms:Dyspnea and Shortness of Breath.  Sonographer:    Sheralyn Boatman RDCS Referring Phys: [phone] Mashayla Lavin CALDWELL POWELL JR  Sonographer Comments: Extremely thin habitus. Low parasternal. IMPRESSIONS   1. Left ventricular ejection fraction, by estimation, is 60 to 65%. The left ventricle has normal function. The left ventricle has no regional wall motion abnormalities. Left ventricular diastolic parameters are consistent with Grade I diastolic dysfunction (impaired relaxation).  2. Right ventricular systolic function is normal. The right ventricular size is normal. There is mildly elevated pulmonary artery systolic pressure.  3. Left atrial size was moderately dilated.  4. Right atrial size was mildly dilated.  5. The mitral valve is abnormal. Mild mitral  valve regurgitation. No evidence of mitral stenosis.  6. Calcified non coronary cusp. The aortic valve is tricuspid. There is mild calcification of the aortic valve. Aortic valve regurgitation is not visualized. Aortic valve sclerosis is present, with no evidence of aortic valve stenosis.  7. The inferior vena cava is normal in size with greater than 50% respiratory variability, suggesting right atrial pressure of 3 mmHg. FINDINGS  Left Ventricle: Left ventricular ejection fraction, by estimation, is 60 to 65%. The left ventricle has normal function. The left ventricle has no regional wall motion abnormalities. The left ventricular internal cavity size was normal in size. There is  no left ventricular hypertrophy. Left ventricular diastolic parameters are consistent with Grade I diastolic dysfunction (impaired relaxation). Right Ventricle: The right ventricular size is normal. No increase in right ventricular wall thickness. Right ventricular systolic function is normal. There is mildly elevated pulmonary artery systolic pressure. The tricuspid regurgitant velocity is 2.68  m/s, and with an assumed right atrial pressure of 8 mmHg, the estimated right ventricular systolic pressure is 36.7 mmHg. Left Atrium: Left atrial size was moderately dilated. Right Atrium: Right atrial size was mildly dilated. Pericardium: There is no evidence of pericardial effusion. Mitral  Valve: The mitral valve is abnormal. There is mild thickening of the mitral valve leaflet(s). Mild mitral valve regurgitation. No evidence of mitral valve stenosis. Tricuspid Valve: The tricuspid valve is normal in structure. Tricuspid valve regurgitation is mild . No evidence of tricuspid stenosis. Aortic Valve: Calcified non coronary cusp. The aortic valve is tricuspid. There is mild calcification of the aortic valve. Aortic valve regurgitation is not visualized. Aortic valve sclerosis is present, with no evidence of aortic valve stenosis. Pulmonic Valve: The pulmonic valve was normal in structure. Pulmonic valve regurgitation is mild. No evidence of pulmonic stenosis. Aorta: The aortic root is normal in size and structure. Venous: The inferior vena cava is normal in size with greater than 50% respiratory variability, suggesting right atrial pressure of 3 mmHg. IAS/Shunts: The interatrial septum appears to be lipomatous. No atrial level shunt detected by color flow Doppler.  LEFT VENTRICLE PLAX 2D LVIDd:         3.90 cm   Diastology LVIDs:         2.10 cm   LV e' medial:    5.22 cm/s LV PW:         1.00 cm   LV E/e' medial:  10.2 LV IVS:        1.10 cm   LV e' lateral:   4.47 cm/s LVOT diam:     1.90 cm   LV E/e' lateral: 12.0 LV SV:         76 LV SV Index:   61 LVOT Area:     2.84 cm                           3D Volume EF:                          3D EF:        72 %                          LV EDV:       77 ml  LV ESV:       22 ml                          LV SV:        55 ml RIGHT VENTRICLE             IVC RV S prime:     18.30 cm/s  IVC diam: 2.00 cm TAPSE (M-mode): 2.4 cm LEFT ATRIUM           Index        RIGHT ATRIUM           Index LA diam:      4.40 cm 3.54 cm/m   RA Area:     12.60 cm LA Vol (A2C): 52.8 ml 42.45 ml/m  RA Volume:   27.90 ml  22.43 ml/m LA Vol (A4C): 38.1 ml 30.63 ml/m  AORTIC VALVE             PULMONIC VALVE LVOT Vmax:   115.00 cm/s PR End Diast Vel: 1.66 msec  LVOT Vmean:  73.200 cm/s LVOT VTI:    0.269 m  AORTA Ao Root diam: 2.90 cm Ao Asc diam:  2.70 cm MITRAL VALVE               TRICUSPID VALVE MV Area (PHT): 2.91 cm    TR Peak grad:   28.7 mmHg MV Decel Time: 261 msec    TR Vmax:        268.00 cm/s MV E velocity: 53.50 cm/s MV Coralie Stanke velocity: 70.20 cm/s  SHUNTS MV E/Davionne Dowty ratio:  0.76        Systemic VTI:  0.27 m                            Systemic Diam: 1.90 cm Charlton Haws MD Electronically signed by Charlton Haws MD Signature Date/Time: 07/01/2022/10:18:20 AM    Final    DG Chest 2 View  Result Date: 06/30/2022 CLINICAL DATA:  cp sob EXAM: CHEST - 2 VIEW COMPARISON:  CXR 06/22/22 FINDINGS: Pulmonary hyperinflation. No pleural effusion. No pneumothorax. Unchanged cardiac and mediastinal contours. Emphysematous changes are redemonstrated. No focal airspace opacity. No radiographically apparent displaced rib fractures. Visualized upper abdomen is unremarkable. Vertebral heights are maintained. Exaggerated thoracic kyphosis IMPRESSION: Emphysematous changes without Zeddie Njie focal airspace opacity. Electronically Signed   By: Lorenza Cambridge M.D.   On: 06/30/2022 15:11        Scheduled Meds:  aspirin EC  81 mg Oral Daily   heparin injection (subcutaneous)  5,000 Units Subcutaneous Q12H   metoprolol tartrate  25 mg Oral BID   nitroGLYCERIN  0.5 inch Topical Q6H   polyethylene glycol  17 g Oral Daily   rosuvastatin  5 mg Oral Daily   Continuous Infusions:  sodium chloride 50 mL/hr at 07/02/22 0835     LOS: 0 days    Time spent: over 30 min    Lacretia Nicks, MD [facility]   To contact the attending provider between 7A-7P or the covering provider during after hours 7P-7A, please log into the web site [email or web address] and access using universal [place] password for that web site. If you do not have the password, please call the hospital operator.  07/02/2022, 9:39 AM

## 2022-07-02 NOTE — Progress Notes (Signed)
Subjective:  Patient do not speak English at all as per her son feeling little better since admission  Objective:  Vital Signs in the last 24 hours: Temp:  [97.7 F (36.5 C)-98.1 F (36.7 C)] 97.8 F (36.6 C) (06/16 0507) Pulse Rate:  [57-62] 57 (06/15 1949) Resp:  [16-18] 18 (06/16 0507) BP: (124-147)/(55-90) 134/90 (06/16 0507) SpO2:  [97 %-100 %] 97 % (06/16 0507) Weight:  [35.5 kg] 35.5 kg (06/16 0507)  Intake/Output from previous day: 06/15 0701 - 06/16 0700 In: 1794.3 [P.O.:410; I.V.:1384.3] Out: 750 [Urine:750] Intake/Output from this shift: No intake/output data recorded.  Physical Exam: Neck: no adenopathy, no carotid bruit, no JVD, and supple, symmetrical, trachea midline Lungs: Decreased breath sounds at bases with occasional coarse rhonchi Heart: regular rate and rhythm, S1, S2 normal, and 2/6 systolic murmur noted Abdomen: soft, non-tender; bowel sounds normal; no masses,  no organomegaly Extremities: extremities normal, atraumatic, no cyanosis or edema  Lab Results: Recent Labs    07/01/22 0054 07/02/22 0042  WBC 5.9 10.9*  HGB 12.0 10.9*  PLT 251 214   Recent Labs    06/30/22 1350 06/30/22 2029 07/02/22 0042  NA 136  --  139  K 3.8  --  4.0  CL 101  --  108  CO2 25  --  25  GLUCOSE 86  --  93  BUN 27*  --  17  CREATININE 1.19* 1.19* 0.87   No results for input(s): "TROPONINI" in the last 72 hours.  Invalid input(s): "CK", "MB" Hepatic Function Panel No results for input(s): "PROT", "ALBUMIN", "AST", "ALT", "ALKPHOS", "BILITOT", "BILIDIR", "IBILI" in the last 72 hours. Recent Labs    07/01/22 0054  CHOL 144   No results for input(s): "PROTIME" in the last 72 hours.  Imaging: Imaging results have been reviewed and ECHOCARDIOGRAM COMPLETE  Result Date: 07/01/2022    ECHOCARDIOGRAM REPORT   Patient Name:   Heidi Spence Date of Exam: 07/01/2022 Medical Rec #:  [id number]   Height:       60.0 in Accession #:    [id number]  Weight:       76.9 lb  Date of Birth:  1940/12/15   BSA:          1.244 m Patient Age:    81 years    BP:           122/58 mmHg Patient Gender: F           HR:           59 bpm. Exam Location:  Inpatient Procedure: 2D Echo, 3D Echo, Cardiac Doppler and Color Doppler Indications:    R00.8 Other abnormalities of heart beat  History:        Patient has no prior history of Echocardiogram examinations.                 Signs/Symptoms:Dyspnea and Shortness of Breath.  Sonographer:    Sheralyn Boatman RDCS Referring Phys: [phone] CALDWELL POWELL JR  Sonographer Comments: Extremely thin habitus. Low parasternal. IMPRESSIONS  1. Left ventricular ejection fraction, by estimation, is 60 to 65%. The left ventricle has normal function. The left ventricle has no regional wall motion abnormalities. Left ventricular diastolic parameters are consistent with Grade I diastolic dysfunction (impaired relaxation).  2. Right ventricular systolic function is normal. The right ventricular size is normal. There is mildly elevated pulmonary artery systolic pressure.  3. Left atrial size was moderately dilated.  4. Right atrial size was mildly dilated.  5. The mitral valve is abnormal. Mild mitral valve regurgitation. No evidence of mitral stenosis.  6. Calcified non coronary cusp. The aortic valve is tricuspid. There is mild calcification of the aortic valve. Aortic valve regurgitation is not visualized. Aortic valve sclerosis is present, with no evidence of aortic valve stenosis.  7. The inferior vena cava is normal in size with greater than 50% respiratory variability, suggesting right atrial pressure of 3 mmHg. FINDINGS  Left Ventricle: Left ventricular ejection fraction, by estimation, is 60 to 65%. The left ventricle has normal function. The left ventricle has no regional wall motion abnormalities. The left ventricular internal cavity size was normal in size. There is  no left ventricular hypertrophy. Left ventricular diastolic parameters are consistent with Grade I  diastolic dysfunction (impaired relaxation). Right Ventricle: The right ventricular size is normal. No increase in right ventricular wall thickness. Right ventricular systolic function is normal. There is mildly elevated pulmonary artery systolic pressure. The tricuspid regurgitant velocity is 2.68  m/s, and with an assumed right atrial pressure of 8 mmHg, the estimated right ventricular systolic pressure is 36.7 mmHg. Left Atrium: Left atrial size was moderately dilated. Right Atrium: Right atrial size was mildly dilated. Pericardium: There is no evidence of pericardial effusion. Mitral Valve: The mitral valve is abnormal. There is mild thickening of the mitral valve leaflet(s). Mild mitral valve regurgitation. No evidence of mitral valve stenosis. Tricuspid Valve: The tricuspid valve is normal in structure. Tricuspid valve regurgitation is mild . No evidence of tricuspid stenosis. Aortic Valve: Calcified non coronary cusp. The aortic valve is tricuspid. There is mild calcification of the aortic valve. Aortic valve regurgitation is not visualized. Aortic valve sclerosis is present, with no evidence of aortic valve stenosis. Pulmonic Valve: The pulmonic valve was normal in structure. Pulmonic valve regurgitation is mild. No evidence of pulmonic stenosis. Aorta: The aortic root is normal in size and structure. Venous: The inferior vena cava is normal in size with greater than 50% respiratory variability, suggesting right atrial pressure of 3 mmHg. IAS/Shunts: The interatrial septum appears to be lipomatous. No atrial level shunt detected by color flow Doppler.  LEFT VENTRICLE PLAX 2D LVIDd:         3.90 cm   Diastology LVIDs:         2.10 cm   LV e' medial:    5.22 cm/s LV PW:         1.00 cm   LV E/e' medial:  10.2 LV IVS:        1.10 cm   LV e' lateral:   4.47 cm/s LVOT diam:     1.90 cm   LV E/e' lateral: 12.0 LV SV:         76 LV SV Index:   61 LVOT Area:     2.84 cm                           3D Volume EF:                           3D EF:        72 %                          LV EDV:       77 ml  LV ESV:       22 ml                          LV SV:        55 ml RIGHT VENTRICLE             IVC RV S prime:     18.30 cm/s  IVC diam: 2.00 cm TAPSE (M-mode): 2.4 cm LEFT ATRIUM           Index        RIGHT ATRIUM           Index LA diam:      4.40 cm 3.54 cm/m   RA Area:     12.60 cm LA Vol (A2C): 52.8 ml 42.45 ml/m  RA Volume:   27.90 ml  22.43 ml/m LA Vol (A4C): 38.1 ml 30.63 ml/m  AORTIC VALVE             PULMONIC VALVE LVOT Vmax:   115.00 cm/s PR End Diast Vel: 1.66 msec LVOT Vmean:  73.200 cm/s LVOT VTI:    0.269 m  AORTA Ao Root diam: 2.90 cm Ao Asc diam:  2.70 cm MITRAL VALVE               TRICUSPID VALVE MV Area (PHT): 2.91 cm    TR Peak grad:   28.7 mmHg MV Decel Time: 261 msec    TR Vmax:        268.00 cm/s MV E velocity: 53.50 cm/s MV A velocity: 70.20 cm/s  SHUNTS MV E/A ratio:  0.76        Systemic VTI:  0.27 m                            Systemic Diam: 1.90 cm Charlton Haws MD Electronically signed by Charlton Haws MD Signature Date/Time: 07/01/2022/10:18:20 AM    Final    DG Chest 2 View  Result Date: 06/30/2022 CLINICAL DATA:  cp sob EXAM: CHEST - 2 VIEW COMPARISON:  CXR 06/22/22 FINDINGS: Pulmonary hyperinflation. No pleural effusion. No pneumothorax. Unchanged cardiac and mediastinal contours. Emphysematous changes are redemonstrated. No focal airspace opacity. No radiographically apparent displaced rib fractures. Visualized upper abdomen is unremarkable. Vertebral heights are maintained. Exaggerated thoracic kyphosis IMPRESSION: Emphysematous changes without a focal airspace opacity. Electronically Signed   By: Lorenza Cambridge M.D.   On: 06/30/2022 15:11    Cardiac Studies:  Assessment/Plan:  New onset angina with abnormal EKG rule out MI rule out coronary insufficiency Mild decompensated congestive heart failure Hypertensive heart disease Probable interstitial lung  disease Hyperlipidemia Osteoporosis Plan Continue present management Reschedule for nuclear stress test in a.m.  LOS: 0 days    Rinaldo Cloud 07/02/2022, 7:13 AM

## 2022-07-03 ENCOUNTER — Other Ambulatory Visit (HOSPITAL_COMMUNITY): Payer: Self-pay

## 2022-07-03 ENCOUNTER — Telehealth: Payer: Self-pay | Admitting: Pulmonary Disease

## 2022-07-03 ENCOUNTER — Observation Stay (HOSPITAL_COMMUNITY): Payer: 59

## 2022-07-03 DIAGNOSIS — R0609 Other forms of dyspnea: Secondary | ICD-10-CM | POA: Diagnosis not present

## 2022-07-03 DIAGNOSIS — R0600 Dyspnea, unspecified: Secondary | ICD-10-CM | POA: Diagnosis not present

## 2022-07-03 MED ORDER — TECHNETIUM TC 99M TETROFOSMIN IV KIT
31.4000 | PACK | Freq: Once | INTRAVENOUS | Status: AC | PRN
  Administered 2022-07-03: 31.4 via INTRAVENOUS

## 2022-07-03 MED ORDER — IPRATROPIUM-ALBUTEROL 0.5-2.5 (3) MG/3ML IN SOLN
3.0000 mL | Freq: Four times a day (QID) | RESPIRATORY_TRACT | 1 refills | Status: DC | PRN
  Filled 2022-07-03: qty 360, 30d supply, fill #0

## 2022-07-03 MED ORDER — REGADENOSON 0.4 MG/5ML IV SOLN
0.4000 mg | Freq: Once | INTRAVENOUS | Status: AC
  Administered 2022-07-03: 0.4 mg via INTRAVENOUS
  Filled 2022-07-03: qty 5

## 2022-07-03 MED ORDER — REGADENOSON 0.4 MG/5ML IV SOLN
INTRAVENOUS | Status: AC
  Filled 2022-07-03: qty 5

## 2022-07-03 MED ORDER — ASPIRIN 81 MG PO TBEC: 81.0000 mg | DELAYED_RELEASE_TABLET | Freq: Every day | ORAL | 12 refills | Status: DC

## 2022-07-03 MED ORDER — TECHNETIUM TC 99M TETROFOSMIN IV KIT
10.8000 | PACK | Freq: Once | INTRAVENOUS | Status: AC | PRN
  Administered 2022-07-03: 10.8 via INTRAVENOUS

## 2022-07-03 MED ORDER — ASPIRIN 81 MG PO TBEC: 81.0000 mg | DELAYED_RELEASE_TABLET | Freq: Every day | ORAL | 1 refills | Status: DC

## 2022-07-03 MED ORDER — IPRATROPIUM-ALBUTEROL 0.5-2.5 (3) MG/3ML IN SOLN: 3.0000 mL | Freq: Four times a day (QID) | RESPIRATORY_TRACT | 1 refills | Status: DC | PRN

## 2022-07-03 NOTE — Progress Notes (Signed)
Subjective:  Patient denies any chest pains states breathing has improved after breathing treatment.  Scheduled for nuclear stress test today  Objective:  Vital Signs in the last 24 hours: Temp:  [97.7 F (36.5 C)-98.5 F (36.9 C)] 97.8 F (36.6 C) (06/17 0730) Pulse Rate:  [49-58] 53 (06/17 0730) Resp:  [17-18] 18 (06/17 0730) BP: (126-164)/(62-80) 164/62 (06/17 0730) SpO2:  [98 %-100 %] 100 % (06/17 0730) Weight:  [35.7 kg] 35.7 kg (06/17 0444)  Intake/Output from previous day: 06/16 0701 - 06/17 0700 In: 469.2 [P.O.:240; I.V.:229.2] Out: 400 [Urine:400] Intake/Output from this shift: No intake/output data recorded.  Physical Exam: Exam unchanged  Lab Results: Recent Labs    07/01/22 0054 07/02/22 0042  WBC 5.9 10.9*  HGB 12.0 10.9*  PLT 251 214   Recent Labs    06/30/22 1350 06/30/22 2029 07/02/22 0042  NA 136  --  139  K 3.8  --  4.0  CL 101  --  108  CO2 25  --  25  GLUCOSE 86  --  93  BUN 27*  --  17  CREATININE 1.19* 1.19* 0.87   No results for input(s): "TROPONINI" in the last 72 hours.  Invalid input(s): "CK", "MB" Hepatic Function Panel No results for input(s): "PROT", "ALBUMIN", "AST", "ALT", "ALKPHOS", "BILITOT", "BILIDIR", "IBILI" in the last 72 hours. Recent Labs    07/01/22 0054  CHOL 144   No results for input(s): "PROTIME" in the last 72 hours.  Imaging: Imaging results have been reviewed  Cardiac Studies:  Assessment/Plan:  New onset angina with abnormal EKG rule out MI rule out coronary insufficiency Mild decompensated congestive heart failure Hypertensive heart disease Chronic bronchiectasis Hyperlipidemia Osteoporosis Plan Continue present management Awaiting nuclear stress test results  LOS: 0 days    Rinaldo Cloud 07/03/2022, 11:02 AM

## 2022-07-03 NOTE — TOC Initial Note (Signed)
Transition of Care [facility]) - Initial/Assessment Note    Patient Details  Name: Heidi Spence MRN: [id number] Date of Birth: [birth year]  Transition of Care [facility]) CM/SW Contact:    Leone Haven, RN Phone Number: 07/03/2022, 4:36 PM  Clinical Narrative:                 Patient lives with son Heidi Spence, she is indep per daughter who is at the bedside, , she has PCP, Heidi Spence.  She has insurance on file, she will need a neb machine, they are ok with [employer] supplying this. She will not need any  HH services.  Daughter will transport her home today.  Expected Discharge Plan: Home/Self Care Barriers to Discharge: No Barriers Identified   Patient Goals and CMS Choice Patient states their goals for this hospitalization and ongoing recovery are:: return home   Choice offered to / list presented to : NA      Expected Discharge Plan and Services In-house Referral: NA Discharge Planning Services: CM Consult Post Acute Care Choice: NA Living arrangements for the past 2 months: Single Family Home Expected Discharge Date: 07/03/22               DME Arranged: N/A DME Agency: NA       HH Arranged: NA          Prior Living Arrangements/Services Living arrangements for the past 2 months: Single Family Home Lives with:: Adult Children Patient language and need for interpreter reviewed:: Yes Do you feel safe going back to the place where you live?: Yes      Need for Family Participation in Patient Care: Yes (Comment) Care giver support system in place?: Yes (comment)   Criminal Activity/Legal Involvement Pertinent to Current Situation/Hospitalization: No - Comment as needed  Activities of Daily Living Home Assistive Devices/Equipment: None ADL Screening (condition at time of admission) Patient's cognitive ability adequate to safely complete daily activities?: No Is the patient deaf or have difficulty hearing?: Yes Does the patient have difficulty seeing, even when wearing  glasses/contacts?: Yes Does the patient have difficulty concentrating, remembering, or making decisions?: Yes Patient able to express need for assistance with ADLs?: Yes Does the patient have difficulty dressing or bathing?: No Independently performs ADLs?: No Walks in Home: Independent Does the patient have difficulty walking or climbing stairs?: Yes Weakness of Legs: Both Weakness of Arms/Hands: None  Permission Sought/Granted                  Emotional Assessment Appearance:: Appears stated age Attitude/Demeanor/Rapport: Engaged Affect (typically observed): Appropriate Orientation: : Oriented to Self, Oriented to Place, Oriented to  Time, Oriented to Situation Alcohol / Substance Use: Not Applicable Psych Involvement: No (comment)  Admission diagnosis:  Dyspnea on exertion [R06.09] Exertional dyspnea [R06.09] Patient Active Problem List   Diagnosis Date Noted   Exertional dyspnea 06/30/2022   PCP:  Pcp, No Pharmacy:   [id number] - [place], [place] - [address] AT [facility] [address] [place] [address] Phone: [phone] Fax: [phone]  [facility] [address] [place] [address] Phone: [phone] Fax: [phone]     Social Determinants of Health (SDOH) Social History: SDOH Screenings   Food Insecurity: No Food Insecurity (07/01/2022)  Housing: High Risk (07/01/2022)  Transportation Needs: No Transportation Needs (07/01/2022)  Utilities: Not At Risk (07/01/2022)  Tobacco Use: Unknown (07/02/2022)   SDOH Interventions:  Readmission Risk Interventions     No data to display

## 2022-07-03 NOTE — Discharge Summary (Signed)
Physician Discharge Summary  Heidi Spence ZOX:[id number] DOB: 04/10/1940 DOA: 06/30/2022  PCP: Pcp, No  Admit date: 06/30/2022 Discharge date: 07/03/2022  Time spent: 40 minutes  Recommendations for Outpatient Follow-up:  Follow outpatient CBC/CMP  Follow allergens with total IgE, IgG, IgM, IgA, ANA, RF, anti CCP, a1at, quant gold outpatient Follow with cardiology Follow with pulmonology  Discharge Diagnoses:  Principal Problem:   Exertional dyspnea   Discharge Condition: stable  Diet recommendation: heart healthy  Filed Weights   07/01/22 0425 07/02/22 0507 07/03/22 0444  Weight: 34.9 kg 35.5 kg 35.7 kg    History of present illness:   Heidi Spence is Heidi Spence Laotian speaking 82 y.o. female with medical history significant of interstitial lung disease, essential hypertension, diastolic CHF, hyperlipidemia, who presents to the ER with exertional dyspnea.   She was evaluated by cardiology.  Stress test was low risk.  Lung disease being worked up by pulmonology.    Stable for discharge on 6/17.  See below for additional details   Hospital Course:  Assessment and Plan:  #1 Shortness of Breath with Exertion:  progressive.  She was gardening about 1 year ago, now SOB just walking around the house.  Currently being evaluated by cardiology, T wave inversions on EKG (II, III, aVF, IV, V, VI), troponins only minimally elevated.   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 stop lisinopril   #3 Chronic Lung Disease: she does not have ILD per pulm, bronchiectasis workup pending.  IgG, IgM, IgA, allergy panel, alpha 1 antitrypsin, quantiferon gold pending at the time of discharge.    #4 history of diastolic CHF: Compensated.  No fluid overload on chest x-ray.  Echo as above.   #5 hyperlipidemia: atorvastatin      Procedures:  Echo IMPRESSIONS     1. Left ventricular ejection fraction, by estimation, is 60 to 65%. The  left ventricle has normal function. The left ventricle has no regional  wall motion abnormalities. Left ventricular diastolic parameters are  consistent with Grade I diastolic  dysfunction (impaired relaxation).   2. Right ventricular systolic function is normal. The right ventricular  size is normal. There is mildly elevated pulmonary artery systolic  pressure.   3. Left atrial size was moderately dilated.   4. Right atrial size was mildly dilated.   5. The mitral valve is abnormal. Mild mitral valve regurgitation. No  evidence of mitral stenosis.   6. Calcified non coronary cusp. The aortic valve is tricuspid. There is  mild calcification of the aortic valve. Aortic valve regurgitation is not  visualized. Aortic valve sclerosis is present, with no evidence of aortic  valve stenosis.   7. The inferior vena cava is normal in size with greater than 50%  respiratory variability, suggesting right atrial pressure of 3 mmHg.   Consultations: Pulm cardiology  Discharge Exam: Vitals:   07/03/22 1208 07/03/22 1211  BP: (!) 141/77 129/69  Pulse: 90 74  Resp:    Temp:    SpO2:  Feeling better No complaints  General: No acute distress. Cardiovascular: RRR Lungs: Clear to auscultation bilaterally Abdomen: Soft, nontender, nondistended Neurological: Alert and oriented 3. Moves all extremities 4 with equal strength. Cranial nerves II through XII grossly intact. Extremities: No clubbing  or cyanosis. No edema.  Discharge Instructions   Discharge Instructions     Call MD for:  difficulty breathing, headache or visual disturbances   Complete by: As directed    Call MD for:  extreme fatigue   Complete by: As directed    Call MD for:  hives   Complete by: As directed    Call MD for:  persistant dizziness or light-headedness   Complete by: As directed    Call MD for:  persistant nausea and vomiting   Complete by: As directed    Call MD for:  redness, tenderness, or signs of infection (pain, swelling, redness, odor or green/yellow discharge around incision site)   Complete by: As directed    Call MD for:  severe uncontrolled pain   Complete by: As directed    Call MD for:  temperature >100.4   Complete by: As directed    Diet - low sodium heart healthy   Complete by: As directed    Discharge instructions   Complete by: As directed    You were seen for shortness of breath with exertion and chest discomfort.   You had Heidi Spence stress test that was low risk.  Continue the aspirin and Lipitor.  Continue your metoprolol.  We had the lung doctors see you.  You do not have interstitial lung disease.  We sent Heidi Spence bronchiectasis workup which is pending at the time of discharge.  IgM, IgG, IgA levels were sent.  An allergy panel was sent.  Alpha 1 antitrypsin and quantiferon gold were sent.  These are pending at the time of discharge.  You should follow up with pulmonology as an outpatient.    We'll send you home with inhalers.  Go ahead an schedule duonebs twice Heidi Spence day.  You can use up to every 6 hours as needed.  Use albuterol as needed for shortness of breath.   Return for new, recurrent, or worsening symptoms.  Please ask your PCP to request records from this hospitalization so they know what was done and what the next steps will be.   For home use only DME Nebulizer machine   Complete by: As directed    Patient needs Heidi Spence nebulizer to treat with the following condition:  Bronchiectasis (HCC)   Length of Need: Lifetime   Increase activity slowly   Complete by: As directed       Allergies as of 07/03/2022       Reactions   Ciprofloxacin Rash        Medication List     STOP taking these medications    lisinopril 40 MG tablet Commonly known as: ZESTRIL       TAKE these medications    albuterol 108 (90 Base) MCG/ACT inhaler Commonly known as: VENTOLIN HFA Inhale 2 puffs into the lungs every 6 (six) hours as needed for wheezing or shortness of breath.   alendronate 70 MG tablet Commonly known as: FOSAMAX Take 70 mg by mouth once Saraia Platner week.   aspirin EC 81 MG tablet Take 1 tablet (81 mg total) by mouth daily. Swallow whole. Start taking on: July 04, 2022   atorvastatin 40 MG tablet Commonly known as: LIPITOR Take 40 mg by mouth daily.   Cranberry  200 MG Caps Take 200 mg by mouth daily.   dextromethorphan-guaiFENesin 30-600 MG 12hr tablet Commonly known as: MUCINEX DM Take 1 tablet by mouth daily as needed for cough.   furosemide 20 MG tablet Commonly known as: LASIX Take 10 mg by mouth daily.   HYDROcodone-acetaminophen 5-325 MG tablet Commonly known as: Norco Take 1-2 tablets by mouth every 6 (six) hours as needed.   ipratropium-albuterol 0.5-2.5 (3) MG/3ML Soln Commonly known as: DUONEB Take 3 mLs by nebulization every 6 (six) hours as needed (shortness of breath).   loratadine 10 MG tablet Commonly known as: CLARITIN Take 10 mg by mouth daily as needed for allergies or rhinitis.   metoprolol succinate 50 MG 24 hr tablet Commonly known as: TOPROL-XL Take 50 mg by mouth daily.   multivitamin with minerals Tabs tablet Take 1 tablet by mouth daily.   NON FORMULARY Take 1 tablet by mouth daily. Naturecare - RingStop tablet.   Soothe Nighttime Oint Place 1 Application into both eyes at bedtime.   traZODone 50 MG tablet Commonly known as: DESYREL Take 50 mg by mouth at bedtime.               Durable Medical  Equipment  (From admission, onward)           Start     Ordered   07/03/22 1629  For home use only DME Nebulizer/meds  Once       Question Answer Comment  Patient needs Heidi Spence nebulizer to treat with the following condition Bronchiectasis (HCC)   Length of Need Lifetime      07/03/22 1628   07/03/22 0000  For home use only DME Nebulizer machine       Question Answer Comment  Patient needs Heidi Spence nebulizer to treat with the following condition Bronchiectasis (HCC)   Length of Need Lifetime      07/03/22 1604           Allergies  Allergen Reactions   Ciprofloxacin Rash    Follow-up Information     Rinaldo Cloud, MD Follow up.   Specialty: Cardiology Why: call for Heidi Spence follow up appointment Contact information: [address] [place] [address] [phone]         [facility] Follow up.   Specialty: Pulmonology Why: call for Heidi Spence follow up appointment Contact information: [address] [place] [address] [phone]        [employer] Follow up.   Why: neb machine Contact information: [phone]        [name], MD Follow up.   Specialty: Internal Medicine Why: GO: JUNE 20 AT 10:20AM Contact information: [address] [place] [address] [phone]                  The results of significant diagnostics from this hospitalization (including imaging, microbiology, ancillary and laboratory) are listed below for reference.    Significant Diagnostic Studies: NM Myocar Multi W/Spect W/Wall Motion / EF  Result Date: 07/03/2022 CLINICAL DATA:  Chest pain. EXAM: MYOCARDIAL IMAGING WITH SPECT (REST AND PHARMACOLOGIC-STRESS) GATED LEFT VENTRICULAR WALL MOTION STUDY LEFT VENTRICULAR EJECTION FRACTION TECHNIQUE: Standard myocardial SPECT imaging was performed after resting intravenous injection of 10 mCi Tc-50m tetrofosmin. Subsequently, intravenous infusion  of Lexiscan was performed under the supervision of the Cardiology staff. At peak effect of the drug, 30 mCi Tc-30m tetrofosmin was injected intravenously and standard myocardial SPECT  imaging was performed. Quantitative gated imaging was also performed to evaluate left ventricular wall motion, and estimate left ventricular ejection fraction. COMPARISON:  None Available. FINDINGS: Perfusion: No decreased activity in the left ventricle on stress imaging to suggest reversible ischemia or infarction. Wall Motion: Normal left ventricular wall motion. No left ventricular dilation. Left Ventricular Ejection Fraction: 90 % End diastolic volume 42 ml End systolic volume 4 ml IMPRESSION: 1. No reversible ischemia or infarction. 2. Normal left ventricular wall motion. 3. Left ventricular ejection fraction 90% 4. Non invasive risk stratification*: Low *2012 Appropriate Use Criteria for Coronary Revascularization Focused Update: J Am Coll Cardiol. 2012;59(9):857-881. http://content.dementiazones.com.aspx?articleid=[email or web address] Electronically Signed   By: Signa Kell M.D.   On: 07/03/2022 14:42   ECHOCARDIOGRAM COMPLETE  Result Date: 07/01/2022    ECHOCARDIOGRAM REPORT   Patient Name:   ZAKERIA KARTCHNER Date of Exam: 07/01/2022 Medical Rec #:  [id number]   Height:       60.0 in Accession #:    [id number]  Weight:       76.9 lb Date of Birth:  1940-12-02   BSA:          1.244 m Patient Age:    81 years    BP:           122/58 mmHg Patient Gender: F           HR:           59 bpm. Exam Location:  Inpatient Procedure: 2D Echo, 3D Echo, Cardiac Doppler and Color Doppler Indications:    R00.8 Other abnormalities of heart beat  History:        Patient has no prior history of Echocardiogram examinations.                 Signs/Symptoms:Dyspnea and Shortness of Breath.  Sonographer:    Sheralyn Boatman RDCS Referring Phys: [phone] Heidi Spence CALDWELL POWELL JR  Sonographer Comments: Extremely thin habitus. Low parasternal. IMPRESSIONS  1. Left ventricular  ejection fraction, by estimation, is 60 to 65%. The left ventricle has normal function. The left ventricle has no regional wall motion abnormalities. Left ventricular diastolic parameters are consistent with Grade I diastolic dysfunction (impaired relaxation).  2. Right ventricular systolic function is normal. The right ventricular size is normal. There is mildly elevated pulmonary artery systolic pressure.  3. Left atrial size was moderately dilated.  4. Right atrial size was mildly dilated.  5. The mitral valve is abnormal. Mild mitral valve regurgitation. No evidence of mitral stenosis.  6. Calcified non coronary cusp. The aortic valve is tricuspid. There is mild calcification of the aortic valve. Aortic valve regurgitation is not visualized. Aortic valve sclerosis is present, with no evidence of aortic valve stenosis.  7. The inferior vena cava is normal in size with greater than 50% respiratory variability, suggesting right atrial pressure of 3 mmHg. FINDINGS  Left Ventricle: Left ventricular ejection fraction, by estimation, is 60 to 65%. The left ventricle has normal function. The left ventricle has no regional wall motion abnormalities. The left ventricular internal cavity size was normal in size. There is  no left ventricular hypertrophy. Left ventricular diastolic parameters are consistent with Grade I diastolic dysfunction (impaired relaxation). Right Ventricle: The right ventricular size is normal. No increase in right ventricular wall thickness. Right ventricular systolic function is normal. There is mildly elevated pulmonary artery systolic pressure. The tricuspid regurgitant velocity is 2.68  m/s, and with an assumed right atrial pressure of 8 mmHg, the estimated right ventricular  systolic pressure is 36.7 mmHg. Left Atrium: Left atrial size was moderately dilated. Right Atrium: Right atrial size was mildly dilated. Pericardium: There is no evidence of pericardial effusion. Mitral Valve: The mitral  valve is abnormal. There is mild thickening of the mitral valve leaflet(s). Mild mitral valve regurgitation. No evidence of mitral valve stenosis. Tricuspid Valve: The tricuspid valve is normal in structure. Tricuspid valve regurgitation is mild . No evidence of tricuspid stenosis. Aortic Valve: Calcified non coronary cusp. The aortic valve is tricuspid. There is mild calcification of the aortic valve. Aortic valve regurgitation is not visualized. Aortic valve sclerosis is present, with no evidence of aortic valve stenosis. Pulmonic Valve: The pulmonic valve was normal in structure. Pulmonic valve regurgitation is mild. No evidence of pulmonic stenosis. Aorta: The aortic root is normal in size and structure. Venous: The inferior vena cava is normal in size with greater than 50% respiratory variability, suggesting right atrial pressure of 3 mmHg. IAS/Shunts: The interatrial septum appears to be lipomatous. No atrial level shunt detected by color flow Doppler.  LEFT VENTRICLE PLAX 2D LVIDd:         3.90 cm   Diastology LVIDs:         2.10 cm   LV e' medial:    5.22 cm/s LV PW:         1.00 cm   LV E/e' medial:  10.2 LV IVS:        1.10 cm   LV e' lateral:   4.47 cm/s LVOT diam:     1.90 cm   LV E/e' lateral: 12.0 LV SV:         76 LV SV Index:   61 LVOT Area:     2.84 cm                           3D Volume EF:                          3D EF:        72 %                          LV EDV:       77 ml                          LV ESV:       22 ml                          LV SV:        55 ml RIGHT VENTRICLE             IVC RV S prime:     18.30 cm/s  IVC diam: 2.00 cm TAPSE (M-mode): 2.4 cm LEFT ATRIUM           Index        RIGHT ATRIUM           Index LA diam:      4.40 cm 3.54 cm/m   RA Area:     12.60 cm LA Vol (A2C): 52.8 ml 42.45 ml/m  RA Volume:   27.90 ml  22.43 ml/m LA Vol (A4C): 38.1 ml 30.63 ml/m  AORTIC VALVE             PULMONIC VALVE LVOT Vmax:  115.00 cm/s PR End Diast Vel: 1.66 msec LVOT Vmean:   73.200 cm/s LVOT VTI:    0.269 m  AORTA Ao Root diam: 2.90 cm Ao Asc diam:  2.70 cm MITRAL VALVE               TRICUSPID VALVE MV Area (PHT): 2.91 cm    TR Peak grad:   28.7 mmHg MV Decel Time: 261 msec    TR Vmax:        268.00 cm/s MV E velocity: 53.50 cm/s MV Laelynn Blizzard velocity: 70.20 cm/s  SHUNTS MV E/Benito Lemmerman ratio:  0.76        Systemic VTI:  0.27 m                            Systemic Diam: 1.90 cm Charlton Haws MD Electronically signed by Charlton Haws MD Signature Date/Time: 07/01/2022/10:18:20 AM    Final    DG Chest 2 View  Result Date: 06/30/2022 CLINICAL DATA:  cp sob EXAM: CHEST - 2 VIEW COMPARISON:  CXR 06/22/22 FINDINGS: Pulmonary hyperinflation. No pleural effusion. No pneumothorax. Unchanged cardiac and mediastinal contours. Emphysematous changes are redemonstrated. No focal airspace opacity. No radiographically apparent displaced rib fractures. Visualized upper abdomen is unremarkable. Vertebral heights are maintained. Exaggerated thoracic kyphosis IMPRESSION: Emphysematous changes without Jamien Casanova focal airspace opacity. Electronically Signed   By: Lorenza Cambridge M.D.   On: 06/30/2022 15:11    Microbiology: No results found for this or any previous visit (from the past 240 hour(s)).   Labs: Basic Metabolic Panel: Recent Labs  Lab 06/30/22 1350 06/30/22 2029 07/02/22 0042  NA 136  --  139  K 3.8  --  4.0  CL 101  --  108  CO2 25  --  25  GLUCOSE 86  --  93  BUN 27*  --  17  CREATININE 1.19* 1.19* 0.87  CALCIUM 9.2  --  8.4*   Liver Function Tests: No results for input(s): "AST", "ALT", "ALKPHOS", "BILITOT", "PROT", "ALBUMIN" in the last 168 hours. No results for input(s): "LIPASE", "AMYLASE" in the last 168 hours. No results for input(s): "AMMONIA" in the last 168 hours. CBC: Recent Labs  Lab 06/30/22 1350 06/30/22 2029 07/01/22 0054 07/02/22 0042  WBC 7.4 7.0 5.9 10.9*  HGB 12.8 12.7 12.0 10.9*  HCT 40.1 38.8 36.3 33.2*  MCV 92.0 90.4 90.3 91.2  PLT 272 226 251 214   Cardiac  Enzymes: No results for input(s): "CKTOTAL", "CKMB", "CKMBINDEX", "TROPONINI" in the last 168 hours. BNP: BNP (last 3 results) Recent Labs    06/30/22 1622  BNP 261.7*    ProBNP (last 3 results) No results for input(s): "PROBNP" in the last 8760 hours.  CBG: No results for input(s): "GLUCAP" in the last 168 hours.     Signed:  Lacretia Nicks MD.  [facility] 07/03/2022, 4:46 PM

## 2022-07-04 LAB — ANA: Anti Nuclear Antibody (ANA): NEGATIVE

## 2022-07-04 LAB — IGA: IgA: 327 mg/dL (ref 64–422)

## 2022-07-04 LAB — IGG: IgG (Immunoglobin G), Serum: 1171 mg/dL (ref 586–1602)

## 2022-07-04 LAB — IGM: IgM (Immunoglobulin M), Srm: 34 mg/dL (ref 26–217)

## 2022-07-04 LAB — CYCLIC CITRUL PEPTIDE ANTIBODY, IGG/IGA: CCP Antibodies IgG/IgA: 6 units (ref 0–19)

## 2022-07-04 LAB — RHEUMATOID FACTOR: Rheumatoid fact SerPl-aCnc: <10 IU/mL (ref <14.0)

## 2022-07-05 LAB — ALPHA-1-ANTITRYPSIN PHENOTYP: A-1 Antitrypsin, Ser: 120 mg/dL (ref 101–187)

## 2022-07-06 LAB — QUANTIFERON-TB GOLD PLUS (RQFGPL)
QuantiFERON Mitogen Value: >10 IU/mL
QuantiFERON Nil Value: 0.11 IU/mL
QuantiFERON TB1 Ag Value: 0.12 IU/mL
QuantiFERON TB2 Ag Value: 0.12 IU/mL

## 2022-07-06 LAB — QUANTIFERON-TB GOLD PLUS: QuantiFERON-TB Gold Plus: NEGATIVE

## 2022-07-07 LAB — ALLERGENS W/TOTAL IGE AREA 2

## 2022-07-14 ENCOUNTER — Telehealth (HOSPITAL_COMMUNITY): Payer: Self-pay | Admitting: Cardiology

## 2022-07-14 DIAGNOSIS — R0609 Other forms of dyspnea: Secondary | ICD-10-CM

## 2022-07-14 NOTE — Telephone Encounter (Signed)
Daughter (current patient of Dr Gala Romney) called to request referral for her mother Heidi Spence.  Mother previously lived in [place] however now lives with daughter and all care is being transferred to [place].   Please review Daughter ok with general cardiology if pt not appropriate for AHF

## 2022-07-17 NOTE — Telephone Encounter (Signed)
Daughter Kathrynn Running aware)

## 2022-08-10 ENCOUNTER — Inpatient Hospital Stay: Payer: 59 | Admitting: Internal Medicine

## 2022-08-10 NOTE — Progress Notes (Deleted)
OV 08/10/2022  Subjective:  Patient ID: Heidi Spence, female , DOB: [birth year] , age 82 y.o. , MRN: [id number] , ADDRESS: [address] [place] [address] PCP Pcp, No Patient Care Team: Pcp, No as PCP - General  This Provider for this visit: Treatment Team:  Attending Provider: Kalman Shan, MD    08/10/2022 -  No chief complaint on file.    HPI Heidi Spence 82 y.o. -    CT Chest data from date: ****  - personally visualized and independently interpreted : *** - my findings are: ***   PFT      No data to display            LAB RESULTS last 96 hours No results found.  LAB RESULTS last 90 days Recent Results (from the past 2160 hour(s))  Basic metabolic panel     Status: Abnormal   Collection Time: 06/30/22  1:50 PM  Result Value Ref Range   Sodium 136 135 - 145 mmol/L   Potassium 3.8 3.5 - 5.1 mmol/L   Chloride 101 98 - 111 mmol/L   CO2 25 22 - 32 mmol/L   Glucose, Bld 86 70 - 99 mg/dL    Comment: Glucose reference range applies only to samples taken after fasting for at least 8 hours.   BUN 27 (H) 8 - 23 mg/dL   Creatinine, Ser 2.44 (H) 0.44 - 1.00 mg/dL   Calcium 9.2 8.9 - 01.0 mg/dL   GFR, Estimated 46 (L) >60 mL/min    Comment: (NOTE) Calculated using the CKD-EPI Creatinine Equation (2021)    Anion gap 10 5 - 15    Comment: Performed at [facility], [address] [place], [place] [address]  CBC     Status: None   Collection Time: 06/30/22  1:50 PM  Result Value Ref Range   WBC 7.4 4.0 - 10.5 K/uL   RBC 4.36 3.87 - 5.11 MIL/uL   Hemoglobin 12.8 12.0 - 15.0 g/dL   HCT 66.4 40.3 - 47.4 %   MCV 92.0 80.0 - 100.0 fL   MCH 29.4 26.0 - 34.0 pg   MCHC 31.9 30.0 - 36.0 g/dL   RDW 25.9 56.3 - 87.5 %   Platelets 272 150 - 400 K/uL   nRBC 0.0 0.0 - 0.2 %    Comment: Performed at [facility], [address], [place], [place] [address]  Troponin I (High Sensitivity)     Status: None   Collection Time: 06/30/22  1:50  PM  Result Value Ref Range   Troponin I (High Sensitivity) 16 <18 ng/L    Comment: (NOTE) Elevated high sensitivity troponin I (hsTnI) values and significant  changes across serial measurements may suggest ACS but many other  chronic and acute conditions are known to elevate hsTnI results.  Refer to the "Links" section for chest pain algorithms and additional  guidance. Performed at [facility], [address], [place], [place] [address]   Troponin I (High Sensitivity)     Status: Abnormal   Collection Time: 06/30/22  4:22 PM  Result Value Ref Range   Troponin I (High Sensitivity) 19 (H) <18 ng/L    Comment: (NOTE) Elevated high sensitivity troponin I (hsTnI) values and significant  changes across serial measurements may suggest ACS but many other  chronic and acute conditions are known to elevate hsTnI results.  Refer to the "Links" section for chest pain algorithms and additional  guidance. Performed at [facility], [address], [place], [place] [address]   Brain natriuretic peptide     Status: Abnormal   Collection Time: 06/30/22  4:22 PM  Result Value Ref Range   B Natriuretic Peptide 261.7 (H) 0.0 - 100.0 pg/mL    Comment: Performed at [facility], [address] [place], [place] [address]  CBC     Status: None   Collection Time: 06/30/22  8:29 PM  Result Value Ref Range   WBC 7.0 4.0 - 10.5 K/uL   RBC 4.29 3.87 - 5.11 MIL/uL   Hemoglobin 12.7 12.0 - 15.0 g/dL   HCT 27.2 53.6 - 64.4 %   MCV 90.4 80.0 - 100.0 fL   MCH 29.6 26.0 - 34.0 pg   MCHC 32.7 30.0 - 36.0 g/dL   RDW 03.4 74.2 - 59.5 %   Platelets 226 150 - 400 K/uL   nRBC 0.0 0.0 - 0.2 %    Comment: Performed at [facility], [address], [place], [place] [address]  Creatinine, serum     Status: Abnormal   Collection Time: 06/30/22  8:29 PM  Result Value Ref Range   Creatinine, Ser 1.19 (H) 0.44 - 1.00 mg/dL   GFR, Estimated 46 (L) >60 mL/min    Comment: (NOTE) Calculated  using the CKD-EPI Creatinine Equation (2021) Performed at [facility], [address] [place], [place] [address]   Troponin I (High Sensitivity)     Status: Abnormal   Collection Time: 06/30/22  8:29 PM  Result Value Ref Range   Troponin I (High Sensitivity) 18 (H) <18 ng/L    Comment: (NOTE) Elevated high sensitivity troponin I (hsTnI) values and significant  changes across serial measurements may suggest ACS but many other  chronic and acute conditions are known to elevate hsTnI results.  Refer to the "Links" section for chest pain algorithms and additional  guidance. Performed at [facility], [address], [place], [place] [address]   Troponin I (High Sensitivity)     Status: None   Collection Time: 06/30/22 10:19 PM  Result Value Ref Range   Troponin I (High Sensitivity) 16 <18 ng/L    Comment: (NOTE) Elevated high sensitivity troponin I (hsTnI) values and significant  changes across serial measurements may suggest ACS but many other  chronic and acute conditions are known to elevate hsTnI results.  Refer to the "Links" section for chest pain algorithms and additional  guidance. Performed at [facility], [address] [place], [place] [address]   Lipid panel     Status: None   Collection Time: 07/01/22 12:54 AM  Result Value Ref Range   Cholesterol 144 0 - 200 mg/dL   Triglycerides 25 <630 mg/dL   HDL 66 >16 mg/dL   Total CHOL/HDL Ratio 2.2 RATIO   VLDL 5 0 - 40 mg/dL   LDL Cholesterol 73 0 - 99 mg/dL    Comment:        Total Cholesterol/HDL:CHD Risk Coronary Heart Disease Risk Table                     Men   Women  1/2 Average Risk   3.4   3.3  Average Risk       5.0   4.4  2 X Average Risk   9.6   7.1  3 X Average Risk  23.4   11.0  Use the calculated Patient Ratio above and the CHD Risk Table to determine the patient's CHD Risk.        ATP III CLASSIFICATION (LDL):  <100     mg/dL   Optimal  161-096  mg/dL   Near or Above                     Optimal  130-159  mg/dL   Borderline  045-409  mg/dL   High  >811     mg/dL   Very High Performed at [facility], [address] [place], [place] [address]   CBC     Status: None   Collection Time: 07/01/22 12:54 AM  Result Value Ref Range   WBC 5.9 4.0 - 10.5 K/uL   RBC 4.02 3.87 - 5.11 MIL/uL   Hemoglobin 12.0 12.0 - 15.0 g/dL   HCT 29.5 62.1 - 30.8 %   MCV 90.3 80.0 - 100.0 fL   MCH 29.9 26.0 - 34.0 pg   MCHC 33.1 30.0 - 36.0 g/dL   RDW 65.7 84.6 - 96.2 %   Platelets 251 150 - 400 K/uL   nRBC 0.0 0.0 - 0.2 %    Comment: Performed at [facility], [address], [place], [place] [address]  ECHOCARDIOGRAM COMPLETE     Status: None   Collection Time: 07/01/22  8:57 AM  Result Value Ref Range   Weight 1,231.05 oz   Height 60 in   BP 122/58 mmHg   S' Lateral 2.10 cm   Area-P 1/2 2.91 cm2   Est EF 60 - 65%   Basic metabolic panel     Status: Abnormal   Collection Time: 07/02/22 12:42 AM  Result Value Ref Range   Sodium 139 135 - 145 mmol/L   Potassium 4.0 3.5 - 5.1 mmol/L   Chloride 108 98 - 111 mmol/L   CO2 25 22 - 32 mmol/L   Glucose, Bld 93 70 - 99 mg/dL    Comment: Glucose reference range applies only to samples taken after fasting for at least 8 hours.   BUN 17 8 - 23 mg/dL   Creatinine, Ser 1.32 0.44 - 1.00 mg/dL   Calcium 8.4 (L) 8.9 - 10.3 mg/dL   GFR, Estimated >44 >01 mL/min    Comment: (NOTE) Calculated using the CKD-EPI Creatinine Equation (2021)    Anion gap 6 5 - 15    Comment: Performed at [facility], [address], [place], [place] [address]  CBC     Status: Abnormal   Collection Time: 07/02/22 12:42 AM  Result Value Ref Range   WBC 10.9 (H) 4.0 - 10.5 K/uL   RBC 3.64 (L) 3.87 - 5.11 MIL/uL   Hemoglobin 10.9 (L) 12.0 - 15.0 g/dL   HCT 36.6 (L) 44.0 - 34.7 %   MCV 91.2 80.0 - 100.0 fL   MCH 29.9 26.0 - 34.0 pg   MCHC 32.8 30.0 - 36.0 g/dL   RDW 42.5 95.6 - 38.7 %   Platelets 214 150 - 400 K/uL   nRBC 0.0  0.0 - 0.2 %    Comment: Performed at [facility], [address] [place], [place] [address]  Blood gas, arterial     Status: Abnormal   Collection Time: 07/02/22  1:22 PM  Result Value Ref Range   pH, Arterial 7.44 7.35 - 7.45   pCO2 arterial 39 32 - 48 mmHg   pO2, Arterial  82 (L) 83 - 108 mmHg   Bicarbonate 26.5 20.0 - 28.0 mmol/L   Acid-Base Excess 2.3 (H) 0.0 - 2.0 mmol/L   O2 Saturation 98.8 %   Patient temperature 37.0    Collection site LEFT RADIAL    Drawn by [id number]    Allens test (pass/fail) PASS PASS    Comment: Performed at [facility], [address], [place], [place] [address]  Allergens w/Total IgE Area 2     Status: None   Collection Time: 07/03/22  6:53 AM  Result Value Ref Range   Class Description Allergens Comment     Comment: (NOTE)    Levels of Specific IgE       Class  Description of Class    ---------------------------  -----  --------------------                   < 0.10         0         Negative           0.10 -    0.31         0/I       Equivocal/Low           0.32 -    0.55         I         Low           0.56 -    1.40         II        Moderate           1.41 -    3.90         III       High           3.91 -   19.00         IV        Very High          19.01 -  100.00         V         Very High                  >100.00         VI        Very High    IgE (Immunoglobulin E), Serum 12 6 - 495 IU/mL   D Pteronyssinus IgE <0.10 Class 0 kU/L   D Farinae IgE <0.10 Class 0 kU/L   Cat Dander IgE <0.10 Class 0 kU/L   Dog Dander IgE <0.10 Class 0 kU/L   [place] Grass IgE <0.10 Class 0 kU/L   Timothy Grass IgE <0.10 Class 0 kU/L   Johnson Grass IgE <0.10 Class 0 kU/L   Cockroach, German IgE <0.10 Class 0 kU/L   Penicillium Chrysogen IgE <0.10 Class 0 kU/L   Cladosporium Herbarum IgE <0.10 Class 0 kU/L   Aspergillus Fumigatus IgE <0.10 Class 0 kU/L   Alternaria Alternata IgE <0.10 Class 0 kU/L   Maple/Box Elder IgE <0.10 Class 0 kU/L    Common Silver Charletta Cousin IgE <0.10 Class 0 kU/L   Cedar, [place] IgE <0.10 Class 0 kU/L   Oak, White IgE <0.10 Class 0 kU/L   Elm, American IgE <0.10 Class 0 kU/L   Cottonwood IgE <0.10 Class 0 kU/L   Pecan, Hickory IgE <0.10 Class 0 kU/L   White Mulberry IgE <0.10 Class 0 kU/L  Ragweed, Short IgE <0.10 Class 0 kU/L   Pigweed, Rough IgE <0.10 Class 0 kU/L   Sheep Sorrel IgE Qn <0.10 Class 0 kU/L   Mouse Urine IgE <0.10 Class 0 kU/L    Comment: (NOTE) Performed At: [facility] [address] [place], [place] [id number] Jolene Schimke MD [id number]   IgG     Status: None   Collection Time: 07/03/22  6:53 AM  Result Value Ref Range   IgG (Immunoglobin G), Serum 1,171 586 - 1,602 mg/dL    Comment: (NOTE) Performed At: [facility] [address] [place], [place] [id number] Jolene Schimke MD [id number]   IgM     Status: None   Collection Time: 07/03/22  6:53 AM  Result Value Ref Range   IgM (Immunoglobulin M), Srm 34 26 - 217 mg/dL    Comment: (NOTE) Performed At: [facility] [address] [place], [place] [id number] Jolene Schimke MD [id number]   IgA     Status: None   Collection Time: 07/03/22  6:53 AM  Result Value Ref Range   IgA 327 64 - 422 mg/dL    Comment: (NOTE) Performed At: [facility] [address] [place], [place] [id number] Jolene Schimke MD [id number]   ANA     Status: None   Collection Time: 07/03/22  6:53 AM  Result Value Ref Range   Anti Nuclear Antibody (ANA) Negative Negative    Comment: (NOTE) Performed At: [facility] [address] [place], [place] [id number] Jolene Schimke MD [id number]   Rheumatoid factor     Status: None   Collection Time: 07/03/22  6:53 AM  Result Value Ref Range   Rheumatoid fact SerPl-aCnc <10.0 <14.0 IU/mL    Comment: (NOTE) Performed At: [facility] [address] [place], [place] [id number] Jolene Schimke MD [id number]   CYCLIC CITRUL PEPTIDE  ANTIBODY, IGG/IGA     Status: None   Collection Time: 07/03/22  6:53 AM  Result Value Ref Range   CCP Antibodies IgG/IgA 6 0 - 19 units    Comment: (NOTE)                          Negative               <20                          Weak positive      20 - 39                          Moderate positive  40 - 59                          Strong positive        >59 Performed At: [facility] [address] [place], [place] [id number] Jolene Schimke MD [id number]   Olena Mater Plus     Status: None   Collection Time: 07/03/22  6:53 AM  Result Value Ref Range   QuantiFERON Incubation Incubation performed.    QuantiFERON-TB Gold Plus Negative Negative    Comment: (NOTE) No response to M tuberculosis antigens detected. Infection with M tuberculosis is unlikely, but high risk individuals should be considered for additional testing (ATS/IDSA/CDC Clinical Practice Guidelines, 2017). The reference range is an Antigen minus Nil result of <0.35 IU/mL. Chemiluminescence immunoassay methodology Performed At: [facility] [address] [place], [place] [id number] Jolene Schimke MD [id number]   Alpha-1-Antitrypsin Phenotyp     Status: None   Collection Time: 07/03/22  6:53 AM  Result Value Ref Range   A-1 Antitrypsin Pheno MM     Comment: (NOTE) "MM" Phenotype is considered to be "normal", producing normal serum levels of alpha-1-protease inhibitor and not associated with clinical disease. Associated A1A total serum levels in other phenotypes and their incidence in the general population are shown in the table below. Phenotype  Population    % function      A-1-AT Conc.*           Incidence %  compared to MM  (Typical Range)   MM        86.5%           100%         (96 - 189)   MS         8.0%            86%         (83 - 161)   MZ         3.9%            61%         (60 - 111)   FM         0.4%           100%         (93 - 191)   SZ         0.3%             41%         (42 -  75)   SS         0.1%            64%         (62 - 119)   ZZ         0.05%           19%         (16 -  38)   FS         0.05%           70%         (70 - 128)   FZ        Unknown          46%         (44 -  88)   FF        Unknown                        Unknown *A-1-AT concentration in the homozygous MM phenotype is taken as th e reference normal. Percent deficiency in each phenotype is reported relative to this reference. Ranges used to confirm phenotype. Performed At: [facility] [address] [place], [place] [id number] Jolene Schimke MD [id number]    A-1 Antitrypsin, Ser 120 101 - 187 mg/dL  QuantiFERON-TB Gold Plus     Status: None   Collection Time: 07/03/22  6:53 AM  Result Value Ref Range   QuantiFERON Criteria Comment     Comment: (NOTE) QuantiFERON-TB Gold Plus is a qualitative indirect test for M tuberculosis infection (including disease) and is intended for use in conjunction with risk assessment, radiography, and other medical and diagnostic evaluations. The QuantiFERON-TB Gold Plus result is determined by subtracting the Nil  value from either TB antigen (Ag) value. The Mitogen tube serves as a control for the test.    QuantiFERON TB1 Ag Value 0.12 IU/mL   QuantiFERON TB2 Ag Value 0.12 IU/mL   QuantiFERON Nil Value 0.11 IU/mL   QuantiFERON Mitogen Value >10.00 IU/mL    Comment: (NOTE) Performed At: [facility] [address] [place], [place] [id number] Jolene Schimke MD [id number]          has a past medical history of CHF (congestive heart failure) (HCC), HLD (hyperlipidemia), and Hypertension.   reports that she has never smoked. She does not have any smokeless tobacco history on file.  No past surgical history on file.  Allergies  Allergen Reactions   Ciprofloxacin Rash     There is no immunization history on file for this patient.  No family history on file.   Current Outpatient Medications:     albuterol (VENTOLIN HFA) 108 (90 Base) MCG/ACT inhaler, Inhale 2 puffs into the lungs every 6 (six) hours as needed for wheezing or shortness of breath., Disp: , Rfl:    alendronate (FOSAMAX) 70 MG tablet, Take 70 mg by mouth once a week. (Patient not taking: Reported on 07/01/2022), Disp: , Rfl:    aspirin EC 81 MG tablet, Take 1 tablet (81 mg total) by mouth daily. Swallow whole., Disp: 30 tablet, Rfl: 1   atorvastatin (LIPITOR) 40 MG tablet, Take 40 mg by mouth daily., Disp: , Rfl:    Cranberry 200 MG CAPS, Take 200 mg by mouth daily., Disp: , Rfl:    dextromethorphan-guaiFENesin (MUCINEX DM) 30-600 MG 12hr tablet, Take 1 tablet by mouth daily as needed for cough., Disp: , Rfl:    furosemide (LASIX) 20 MG tablet, Take 10 mg by mouth daily., Disp: , Rfl:    HYDROcodone-acetaminophen (NORCO) 5-325 MG per tablet, Take 1-2 tablets by mouth every 6 (six) hours as needed. (Patient not taking: Reported on 07/01/2022), Disp: 20 tablet, Rfl: 0   ipratropium-albuterol (DUONEB) 0.5-2.5 (3) MG/3ML SOLN, Take 3 mLs by nebulization every 6 (six) hours as needed (shortness of breath)., Disp: 360 mL, Rfl: 1   loratadine (CLARITIN) 10 MG tablet, Take 10 mg by mouth daily as needed for allergies or rhinitis., Disp: , Rfl:    metoprolol succinate (TOPROL-XL) 50 MG 24 hr tablet, Take 50 mg by mouth daily., Disp: , Rfl:    Multiple Vitamin (MULTIVITAMIN WITH MINERALS) TABS tablet, Take 1 tablet by mouth daily., Disp: , Rfl:    NON FORMULARY, Take 1 tablet by mouth daily. Naturecare - RingStop tablet., Disp: , Rfl:    traZODone (DESYREL) 50 MG tablet, Take 50 mg by mouth at bedtime., Disp: , Rfl:    White Petrolatum-Mineral Oil (SOOTHE NIGHTTIME) OINT, Place 1 Application into both eyes at bedtime., Disp: , Rfl:       Objective:   There were no vitals filed for this visit.  Estimated body mass index is 15.37 kg/m as calculated from the following:   Height as of 06/30/22: 5' (1.524 m).   Weight as of 07/03/22: 78  lb 11.2 oz (35.7 kg).  @WEIGHTCHANGE @  There were no vitals filed for this visit.   Physical Exam   General: No distress. *** O2 at rest: *** Cane present: *** Sitting in wheel chair: *** Frail: *** Obese: *** Neuro: Alert and Oriented x 3. GCS 15. Speech normal Psych: Pleasant Resp:  Barrel Chest - ***.  Wheeze - ***, Crackles - ***, No overt respiratory distress CVS: Normal  heart sounds. Murmurs - *** Ext: Stigmata of Connective Tissue Disease - *** HEENT: Normal upper airway. PEERL +. No post nasal drip        Assessment:     No diagnosis found.     Plan:     There are no Patient Instructions on file for this visit.   FOLLOWUP No follow-ups on file.    SIGNATURE    Dr. Kalman Shan, M.D., F.C.C.P,  Pulmonary and Critical Care Medicine Staff Physician, [facility] Director - Interstitial Lung Disease  Program  Pulmonary Fibrosis [facility] [place], [place], [address]  Pager: [phone], If no answer or between  15:00h - 7:00h: call [phone] Telephone: [phone]  5:50 AM 08/10/2022   Moderate Complexity MDM OFFICE  2021 E/M guidelines, first released in 2021, with minor revisions added in 2023 and 2024 Must meet the requirements for 2 out of 3 dimensions to qualify.    Number and complexity of problems addressed Amount and/or complexity of data reviewed Risk of complications and/or morbidity  One or more chronic illness with mild exacerbation, OR progression, OR  side effects of treatment  Two or more stable chronic illnesses  One undiagnosed new problem with uncertain prognosis  One acute illness with systemic symptoms   One Acute complicated injury Must meet the requirements for 1 of 3 of the categories)  Category 1: Tests and documents, historian  Any combination of 3 of the following:  Assessment requiring an independent historian  Review of prior external note(s)  from each unique source  Review of results of each unique test  Ordering of each unique test    Category 2: Interpretation of tests   Independent interpretation of a test performed by another physician/other qualified health care professional (not separately reported)  Category 3: Discuss management/tests  Discussion of management or test interpretation with external physician/other qualified health care professional/appropriate source (not separately reported) Moderate risk of morbidity from additional diagnostic testing or treatment Examples only:  Prescription drug management  Decision regarding minor surgery with identfied patient or procedure risk factors  Decision regarding elective major surgery without identified patient or procedure risk factors  Diagnosis or treatment significantly limited by social determinants of health             HIGh Complexity  OFFICE   2021 E/M guidelines, first released in 2021, with minor revisions added in 2023. Must meet the requirements for 2 out of 3 dimensions to qualify.    Number and complexity of problems addressed Amount and/or complexity of data reviewed Risk of complications and/or morbidity  Severe exacerbation of chronic illness  Acute or chronic illnesses that may pose a threat to life or bodily function, e.g., multiple trauma, acute MI, pulmonary embolus, severe respiratory distress, progressive rheumatoid arthritis, psychiatric illness with potential threat to self or others, peritonitis, acute renal failure, abrupt change in neurological status Must meet the requirements for 2 of 3 of the categories)  Category 1: Tests and documents, historian  Any combination of 3 of the following:  Assessment requiring an independent historian  Review of prior external note(s) from each unique source  Review of results of each unique test  Ordering of each unique test    Category 2: Interpretation of tests     Independent interpretation of a test performed by another physician/other qualified health care professional (not separately reported)  Category 3: Discuss management/tests  Discussion  of management or test interpretation with external physician/other qualified health care professional/appropriate source (not separately reported)  HIGH risk of morbidity from additional diagnostic testing or treatment Examples only:  Drug therapy requiring intensive monitoring for toxicity  Decision for elective major surgery with identified pateint or procedure risk factors  Decision regarding hospitalization or escalation of level of care  Decision for DNR or to de-escalate care   Parenteral controlled  substances            LEGEND - Independent interpretation involves the interpretation of a test for which there is a CPT code, and an interpretation or report is customary. When a review and interpretation of a test is performed and documented by the provider, but not separately reported (billed), then this would represent an independent interpretation. This report does not need to conform to the usual standards of a complete report of the test. This does not include interpretation of tests that do not have formal reports such as a complete blood count with differential and blood cultures. Examples would include reviewing a chest radiograph and documenting in the medical record an interpretation, but not separately reporting (billing) the interpretation of the chest radiograph.   An appropriate source includes professionals who are not health care professionals but may be involved in the management of the patient, such as a Clinical research associate, upper officer, case manager or teacher, and does not include discussion with family or informal caregivers.    - SDOH: SDOH are the conditions in the environments where people are born, live, learn, work, play, worship, and age that affect a wide range of health,  functioning, and quality-of-life outcomes and risks. (e.g., housing, food insecurity, transportation, etc.). SDOH-related Z codes ranging from Z55-Z65 are the ICD-10-CM diagnosis codes used to document SDOH data Z55 - Problems related to education and literacy Z56 - Problems related to employment and unemployment Z57 - Occupational exposure to risk factors Z58 - Problems related to physical environment Z59 - Problems related to housing and economic circumstances [phone] - Problems related to social environment [phone] - Problems related to upbringing [phone] - Other problems related to primary support group, including family circumstances Z60 - Problems related to certain psychosocial circumstances Z65 - Problems related to other psychosocial circumstances

## 2022-09-19 NOTE — Progress Notes (Signed)
Referring-A Heidi Nicks, MD Reason for referral-CHF  HPI: 82 year old female for evaluation of CHF at request of A Heidi Nicks, MD.  Previously followed at [facility] and has history of palpitations and SVT.  Previously noted to have reduced LV function on echocardiogram in October 2022 with ejection fraction 40 to 45%.  Patient admitted June 2024 with complaints of progressive dyspnea.  Echocardiogram June 2024 showed normal LV function, grade 1 diastolic dysfunction, mild pulm hypertension, moderate left atrial enlargement, mild right atrial enlargement, mild mitral regurgitation.  Nuclear study June 2024 showed ejection fraction 90% and no ischemia or infarction.  Chest x-ray June 2024 showed emphysema.  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  Since her hospitalization she has mild dyspnea on exertion but no orthopnea, PND or pedal edema.  She has not had chest pain or syncope.  Note she does not speak [place] and history is obtained with the assistance of her daughter.  Current Outpatient Medications  Medication Sig Dispense Refill   albuterol (VENTOLIN HFA) 108 (90 Base) MCG/ACT inhaler Inhale 2 puffs into the lungs every 6 (six) hours as needed for wheezing or shortness of breath.     alendronate (FOSAMAX) 70 MG tablet Take 70 mg by mouth once a week.     aspirin EC 81 MG tablet Take 1 tablet (81 mg total) by mouth daily. Swallow whole. 30 tablet 1   azithromycin (ZITHROMAX) 250 MG tablet Take 250 mg by mouth 3 (three) times a week.     Cranberry 200 MG CAPS Take 200 mg by mouth daily.     dextromethorphan-guaiFENesin (MUCINEX DM) 30-600 MG 12hr tablet Take 1 tablet by mouth daily as needed for cough.     furosemide (LASIX) 20 MG tablet Take 10 mg by mouth daily.     HYDROcodone-acetaminophen (NORCO) 5-325 MG per tablet Take 1-2  tablets by mouth every 6 (six) hours as needed. 20 tablet 0   lisinopril (ZESTRIL) 20 MG tablet Take 20 mg by mouth daily.     loratadine (CLARITIN) 10 MG tablet Take 10 mg by mouth daily as needed for allergies or rhinitis.     Multiple Vitamin (MULTIVITAMIN WITH MINERALS) TABS tablet Take 1 tablet by mouth daily.     NON FORMULARY Take 1 tablet by mouth daily. [employer] - RingStop tablet.     traZODone (DESYREL) 50 MG tablet Take 50 mg by mouth at bedtime.     White Petrolatum-Mineral Oil (SOOTHE NIGHTTIME) OINT Place 1 Application into both eyes at bedtime.     atorvastatin (LIPITOR) 40 MG tablet Take 40 mg by mouth daily.     ipratropium-albuterol (DUONEB) 0.5-2.5 (3) MG/3ML SOLN Take 3 mLs by nebulization every 6 (six) hours as needed (shortness of breath). 360 mL 1   No current facility-administered medications for this visit.    Allergies  Allergen Reactions   Ciprofloxacin Rash     Past Medical History:  Diagnosis Date   Bronchiectasis (HCC)    CHF (congestive heart failure) (HCC)    COPD (chronic obstructive pulmonary disease) (HCC)    HLD (hyperlipidemia)    Hypertension    Prediabetes     Past Surgical History:  Procedure Laterality Date   No prior surgery      Social History   Socioeconomic History   Marital status: Widowed    Spouse name: Not on file   Number of  children: 9   Years of education: Not on file   Highest education level: Not on file  Occupational History   Not on file  Tobacco Use   Smoking status: Never   Smokeless tobacco: Not on file  Substance and Sexual Activity   Alcohol use: No   Drug use: No   Sexual activity: Not on file  Other Topics Concern   Not on file  Social History Narrative   Not on file   Social Determinants of Health   Financial Resource Strain: Not on file  Food Insecurity: No Food Insecurity (07/01/2022)   Hunger Vital Sign    Worried About Running Out of Food in the Last Year: Never true    Ran Out of Food  in the Last Year: Never true  Transportation Needs: No Transportation Needs (07/01/2022)   PRAPARE - Administrator, Civil Service (Medical): No    Lack of Transportation (Non-Medical): No  Physical Activity: Not on file  Stress: Not on file  Social Connections: Not on file  Intimate Partner Violence: Not At Risk (07/01/2022)   Humiliation, Afraid, Rape, and Kick questionnaire    Fear of Current or Ex-Partner: No    Emotionally Abused: No    Physically Abused: No    Sexually Abused: No    Family History  Problem Relation Age of Onset   Kidney failure Mother     ROS: no fevers or chills, productive cough, hemoptysis, dysphasia, odynophagia, melena, hematochezia, dysuria, hematuria, rash, seizure activity, orthopnea, PND, pedal edema, claudication. Remaining systems are negative.  Physical Exam:   Blood pressure 130/60, pulse (!) 44, height 5' (1.524 m), weight 77 lb (34.9 kg), SpO2 100%.  General:  Well developed/thin NAD Skin warm/dry Patient not depressed No peripheral clubbing Back-normal HEENT-normal/normal eyelids Neck supple/normal carotid upstroke bilaterally; no bruits; no JVD; no thyromegaly chest - CTA/ normal expansion CV - RRR/normal S1 and S2; no murmurs, rubs or gallops;  PMI nondisplaced Abdomen -NT/ND, no HSM, no mass, + bowel sounds, no bruit 2+ femoral pulses, no bruits Ext-no edema, chords, 2+ DP Neuro-grossly nonfocal  ECG -June 30, 2022-sinus bradycardia, left ventricular hypertrophy with repolarization abnormality, cannot rule out septal infarct.  Personally reviewed  A/P  1 CHF-previous echocardiogram at [facility] showed ejection fraction 40 to 45% by report.  Most recent echocardiogram here shows normal LV function.  She is not volume overloaded on examination.  Will continue low-dose Lasix.  Check potassium and renal function.  2 history of chest pain-no recent symptoms.  Recent nuclear study showed no ischemia.  3 hypertension-blood  pressure is controlled.  However she is bradycardic and has some degree of fatigue.  I will discontinue Toprol as she is taking 25 mg daily with a heart rate of 44.  Follow blood pressure and adjust medications as needed.  4 hyperlipidemia-continue statin.  5 history of SVT/palpitations-as above we are discontinuing Toprol due to bradycardia/fatigue.  Follow-up for worsening palpitations.  6 dyspnea-patient does have a history of pulmonary disease including bronchiectasis.  Now followed by pulmonary.  Olga Millers, MD

## 2022-09-26 ENCOUNTER — Ambulatory Visit: Payer: 59 | Attending: Cardiology | Admitting: Cardiology

## 2022-09-26 ENCOUNTER — Ambulatory Visit: Payer: 59 | Admitting: Cardiology

## 2022-09-26 ENCOUNTER — Encounter: Payer: Self-pay | Admitting: Cardiology

## 2022-09-26 VITALS — BP 130/60 | HR 44 | Ht 60.0 in | Wt 77.0 lb

## 2022-09-26 DIAGNOSIS — E7800 Pure hypercholesterolemia, unspecified: Secondary | ICD-10-CM | POA: Diagnosis not present

## 2022-09-26 DIAGNOSIS — I10 Essential (primary) hypertension: Secondary | ICD-10-CM

## 2022-09-26 DIAGNOSIS — I5032 Chronic diastolic (congestive) heart failure: Secondary | ICD-10-CM | POA: Diagnosis not present

## 2022-09-26 DIAGNOSIS — R0609 Other forms of dyspnea: Secondary | ICD-10-CM

## 2022-09-26 NOTE — Patient Instructions (Signed)
Medication Instructions:   STOP METOPROLOL  *If you need a refill on your cardiac medications before your next appointment, please call your pharmacy*   Follow-Up: At [facility], you and your health needs are our priority.  As part of our continuing mission to provide you with exceptional heart care, we have created designated Provider Care Teams.  These Care Teams include your primary Cardiologist (physician) and Advanced Practice Providers (APPs -  Physician Assistants and Nurse Practitioners) who all work together to provide you with the care you need, when you need it.  We recommend signing up for the patient portal called "MyChart".  Sign up information is provided on this After Visit Summary.  MyChart is used to connect with patients for Virtual Visits (Telemedicine).  Patients are able to view lab/test results, encounter notes, upcoming appointments, etc.  Non-urgent messages can be sent to your provider as well.   To learn more about what you can do with MyChart, go to [email or web address].    Your next appointment:   4 month(s)  Provider:   Olga Millers MD

## 2022-09-27 ENCOUNTER — Encounter: Payer: Self-pay | Admitting: *Deleted

## 2022-09-27 LAB — BASIC METABOLIC PANEL
BUN/Creatinine Ratio: 17 (ref 12–28)
BUN: 18 mg/dL (ref 8–27)
CO2: 28 mmol/L (ref 20–29)
Calcium: 9.6 mg/dL (ref 8.7–10.3)
Chloride: 103 mmol/L (ref 96–106)
Creatinine, Ser: 1.03 mg/dL — ABNORMAL HIGH (ref 0.57–1.00)
Glucose: 87 mg/dL (ref 70–99)
Potassium: 4.8 mmol/L (ref 3.5–5.2)
Sodium: 140 mmol/L (ref 134–144)
eGFR: 54 mL/min/{1.73_m2} — ABNORMAL LOW (ref >59)

## 2023-01-09 NOTE — Progress Notes (Deleted)
 HPI: FU CHF.  Previously followed at [facility] and has history of palpitations and SVT.  Previously noted to have reduced LV function on echocardiogram in October 2022 with ejection fraction 40 to 45%.  Patient admitted June 2024 with complaints of progressive dyspnea.  Echocardiogram June 2024 showed normal LV function, grade 1 diastolic dysfunction, mild pulm hypertension, moderate left atrial enlargement, mild right atrial enlargement, mild mitral regurgitation.  Nuclear study June 2024 showed ejection fraction 90% and no ischemia or infarction.  Chest x-ray June 2024 showed emphysema.  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  Since last seen,   Current Outpatient Medications  Medication Sig Dispense Refill   albuterol (VENTOLIN HFA) 108 (90 Base) MCG/ACT inhaler Inhale 2 puffs into the lungs every 6 (six) hours as needed for wheezing or shortness of breath.     alendronate (FOSAMAX) 70 MG tablet Take 70 mg by mouth once a week.     aspirin EC 81 MG tablet Take 1 tablet (81 mg total) by mouth daily. Swallow whole. 30 tablet 1   atorvastatin (LIPITOR) 40 MG tablet Take 40 mg by mouth daily.     azithromycin (ZITHROMAX) 250 MG tablet Take 250 mg by mouth 3 (three) times a week.     Cranberry 200 MG CAPS Take 200 mg by mouth daily.     dextromethorphan-guaiFENesin (MUCINEX DM) 30-600 MG 12hr tablet Take 1 tablet by mouth daily as needed for cough.     furosemide (LASIX) 20 MG tablet Take 10 mg by mouth daily.     HYDROcodone-acetaminophen (NORCO) 5-325 MG per tablet Take 1-2 tablets by mouth every 6 (six) hours as needed. 20 tablet 0   ipratropium-albuterol (DUONEB) 0.5-2.5 (3) MG/3ML SOLN Take 3 mLs by nebulization every 6 (six) hours as needed (shortness of breath). 360 mL 1   lisinopril (ZESTRIL) 20 MG tablet Take 20 mg by mouth daily.      loratadine (CLARITIN) 10 MG tablet Take 10 mg by mouth daily as needed for allergies or rhinitis.     Multiple Vitamin (MULTIVITAMIN WITH MINERALS) TABS tablet Take 1 tablet by mouth daily.     NON FORMULARY Take 1 tablet by mouth daily. [employer] - RingStop tablet.     traZODone (DESYREL) 50 MG tablet Take 50 mg by mouth at bedtime.     White Petrolatum-Mineral Oil (SOOTHE NIGHTTIME) OINT Place 1 Application into both eyes at bedtime.     No current facility-administered medications for this visit.     Past Medical History:  Diagnosis Date   Bronchiectasis (HCC)    CHF (congestive heart failure) (HCC)    COPD (chronic obstructive pulmonary disease) (HCC)    HLD (hyperlipidemia)    Hypertension    Prediabetes     Past Surgical History:  Procedure Laterality Date   No prior surgery      Social History   Socioeconomic History   Marital status: Widowed    Spouse name: Not on file   Number of children: 9   Years of education: Not on file   Highest education level: Not on file  Occupational History   Not on file  Tobacco Use   Smoking status: Never   Smokeless tobacco: Not on file  Substance and Sexual Activity   Alcohol use: No   Drug use: No   Sexual activity: Not on file  Other Topics Concern  Not on file  Social History Narrative   Not on file   Social Drivers of Health   Financial Resource Strain: Not on file  Food Insecurity: No Food Insecurity (07/01/2022)   Hunger Vital Sign    Worried About Running Out of Food in the Last Year: Never true    Ran Out of Food in the Last Year: Never true  Transportation Needs: No Transportation Needs (07/01/2022)   PRAPARE - Administrator, Civil Service (Medical): No    Lack of Transportation (Non-Medical): No  Physical Activity: Not on file  Stress: Not on file  Social Connections: Not on file  Intimate Partner Violence: Not At Risk (07/01/2022)   Humiliation, Afraid, Rape, and Kick questionnaire    Fear of  Current or Ex-Partner: No    Emotionally Abused: No    Physically Abused: No    Sexually Abused: No    Family History  Problem Relation Age of Onset   Kidney failure Mother     ROS: no fevers or chills, productive cough, hemoptysis, dysphasia, odynophagia, melena, hematochezia, dysuria, hematuria, rash, seizure activity, orthopnea, PND, pedal edema, claudication. Remaining systems are negative.  Physical Exam: Well-developed well-nourished in no acute distress.  Skin is warm and dry.  HEENT is normal.  Neck is supple.  Chest is clear to auscultation with normal expansion.  Cardiovascular exam is regular rate and rhythm.  Abdominal exam nontender or distended. No masses palpated. Extremities show no edema. neuro grossly intact  ECG- personally reviewed  A/P  1 chronic diastolic congestive heart failure-most recent echocardiogram shows normal LV function.  Patient remains euvolemic.  Continue diuretic at present dose.  2 hypertension-patient's blood pressure is controlled.  Continue present medications and follow-up.  Toprol was discontinued previously due to bradycardia.  3 hyperlipidemia-continue statin.  4 history of SVT/palpitations-beta-blocker discontinued secondary to bradycardia.  Her symptoms are reasonable at present.  5 history of chest pain-previous nuclear study showed no ischemia and no recent symptoms.  6 dyspnea-likely due to pulmonary disease including bronchiectasis.  Olga Millers, MD

## 2023-01-12 ENCOUNTER — Other Ambulatory Visit (HOSPITAL_COMMUNITY): Payer: Self-pay

## 2023-01-23 ENCOUNTER — Ambulatory Visit: Payer: 59 | Admitting: Cardiology

## 2023-03-28 NOTE — Progress Notes (Signed)
 HPI: Follow-up CHF. Previously followed at [facility] and has history of palpitations and SVT. Previously noted to have reduced LV function on echocardiogram in October 2022 with ejection fraction 40 to 45%. Patient admitted June 2024 with complaints of progressive dyspnea. Echocardiogram June 2024 showed normal LV function, grade 1 diastolic dysfunction, mild pulm hypertension, moderate left atrial enlargement, mild right atrial enlargement, mild mitral regurgitation. Nuclear study June 2024 showed ejection fraction 90% and no ischemia or infarction. Chest x-ray June 2024 showed emphysema. 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  Note history is obtained with the assistance of the patient's daughter as she does not speak [place].  Since last seen, she has have congestion recently though no viral syndrome has been diagnosed.  She has some chest discomfort with deep inspiration.  She has not had lower extremity edema or syncope.  Current Outpatient Medications  Medication Sig Dispense Refill   albuterol (VENTOLIN HFA) 108 (90 Base) MCG/ACT inhaler Inhale 2 puffs into the lungs every 6 (six) hours as needed for wheezing or shortness of breath.     alendronate (FOSAMAX) 70 MG tablet Take 70 mg by mouth once a week.     amoxicillin-clavulanate (AUGMENTIN) 875-125 MG tablet Take 1 tablet by mouth 2 (two) times daily.     aspirin EC 81 MG tablet Take 1 tablet (81 mg total) by mouth daily. Swallow whole. 30 tablet 1   azithromycin (ZITHROMAX) 250 MG tablet Take 250 mg by mouth 3 (three) times a week.     Cranberry 200 MG CAPS Take 200 mg by mouth daily.     dextromethorphan-guaiFENesin (MUCINEX DM) 30-600 MG 12hr tablet Take 1 tablet by mouth daily as needed for cough.     furosemide (LASIX) 20 MG tablet Take 20 mg by mouth daily.      HYDROcodone-acetaminophen (NORCO) 5-325 MG per tablet Take 1-2 tablets by mouth every 6 (six) hours as needed. 20 tablet 0   lisinopril (ZESTRIL) 20 MG tablet Take 20 mg by mouth daily.     loratadine (CLARITIN) 10 MG tablet Take 10 mg by mouth daily as needed for allergies or rhinitis.     Multiple Vitamin (MULTIVITAMIN WITH MINERALS) TABS tablet Take 1 tablet by mouth daily.     NON FORMULARY Take 1 tablet by mouth daily. [employer] - RingStop tablet.     traZODone (DESYREL) 50 MG tablet Take 50 mg by mouth at bedtime.     White Petrolatum-Mineral Oil (SOOTHE NIGHTTIME) OINT Place 1 Application into both eyes at bedtime.     atorvastatin (LIPITOR) 40 MG tablet Take 40 mg by mouth daily.     ipratropium-albuterol (DUONEB) 0.5-2.5 (3) MG/3ML SOLN Take 3 mLs by nebulization every 6 (six) hours as needed (shortness of breath). 360 mL 1   No current facility-administered medications for this visit.     Past Medical History:  Diagnosis Date   Bronchiectasis (HCC)    CHF (congestive heart failure) (HCC)    COPD (chronic obstructive pulmonary disease) (HCC)    HLD (hyperlipidemia)    Hypertension    Prediabetes     Past Surgical History:  Procedure Laterality Date   No prior surgery      Social History   Socioeconomic History   Marital status: Widowed    Spouse name: Not on file   Number of children: 9   Years of education: Not on  file   Highest education level: Not on file  Occupational History   Not on file  Tobacco Use   Smoking status: Never   Smokeless tobacco: Not on file  Substance and Sexual Activity   Alcohol use: No   Drug use: No   Sexual activity: Not Currently    Partners: Male  Other Topics Concern   Not on file  Social History Narrative   Not on file   Social Drivers of Health   Financial Resource Strain: Not on file  Food Insecurity: Low Risk  (04/06/2023)   Received from [facility]   Hunger Vital Sign    Worried About Running Out of Food in the  Last Year: Never true    Ran Out of Food in the Last Year: Never true  Transportation Needs: No Transportation Needs (04/06/2023)   Received from [facility]    In the past 12 months, has lack of reliable transportation kept you from medical appointments, meetings, work or from getting things needed for daily living? : No  Physical Activity: Not on file  Stress: Not on file  Social Connections: Not on file  Intimate Partner Violence: Not At Risk (07/01/2022)   Humiliation, Afraid, Rape, and Kick questionnaire    Fear of Current or Ex-Partner: No    Emotionally Abused: No    Physically Abused: No    Sexually Abused: No    Family History  Problem Relation Age of Onset   Kidney failure Mother     ROS: no fevers or chills, productive cough, hemoptysis, dysphasia, odynophagia, melena, hematochezia, dysuria, hematuria, rash, seizure activity, orthopnea, PND, pedal edema, claudication. Remaining systems are negative.  Physical Exam: Well-developed frail in no acute distress.  Skin is warm and dry.  HEENT is normal.  Neck is supple.  Chest is clear to auscultation with normal expansion.  Cardiovascular exam is regular rate and rhythm.  Abdominal exam nontender or distended. No masses palpated. Extremities show no edema. neuro grossly intact  EKG Interpretation Date/Time:  Tuesday April 10 2023 15:54:12 EDT Ventricular Rate:  97 PR Interval:  168 QRS Duration:  72 QT Interval:  390 QTC Calculation: 495 R Axis:   71  Text Interpretation: Sinus rhythm with Premature atrial complexes Minimal voltage criteria for LVH, may be normal variant ( Sokolow-Lyon ) Septal infarct ST & T wave abnormality, consider inferior ischemia ST & T wave abnormality, consider anterolateral ischemia Confirmed by Olga Millers ([id number]) on 04/10/2023 4:27:52 PM    A/P  1 chronic diastolic congestive heart failure-patient is not volume overloaded on examination.  Continue Lasix at present  dose.  2 hypertension-patient's blood pressure is elevated; however her blood pressure medication was recently increased.  They will follow this and we will advance lisinopril if needed.  3 hyperlipidemia-continue statin.  4 history of SVT/palpitations-Toprol was previously discontinued due to fatigue/bradycardia.  5 dyspnea-likely secondary to pulmonary disease including bronchiectasis.  Patient is also followed by pulmonary.  Olga Millers, MD

## 2023-04-10 ENCOUNTER — Encounter: Payer: Self-pay | Admitting: Cardiology

## 2023-04-10 ENCOUNTER — Ambulatory Visit: Payer: 59 | Attending: Cardiology | Admitting: Cardiology

## 2023-04-10 VITALS — BP 156/80 | HR 97 | Ht 60.0 in | Wt 76.8 lb

## 2023-04-10 DIAGNOSIS — I10 Essential (primary) hypertension: Secondary | ICD-10-CM

## 2023-04-10 DIAGNOSIS — E7800 Pure hypercholesterolemia, unspecified: Secondary | ICD-10-CM

## 2023-04-10 DIAGNOSIS — I5032 Chronic diastolic (congestive) heart failure: Secondary | ICD-10-CM

## 2023-04-10 NOTE — Patient Instructions (Signed)
   Follow-Up: At [facility], you and your health needs are our priority.  As part of our continuing mission to provide you with exceptional heart care, we have created designated Provider Care Teams.  These Care Teams include your primary Cardiologist (physician) and Advanced Practice Providers (APPs -  Physician Assistants and Nurse Practitioners) who all work together to provide you with the care you need, when you need it.  We recommend signing up for the patient portal called "MyChart".  Sign up information is provided on this After Visit Summary.  MyChart is used to connect with patients for Virtual Visits (Telemedicine).  Patients are able to view lab/test results, encounter notes, upcoming appointments, etc.  Non-urgent messages can be sent to your provider as well.   To learn more about what you can do with MyChart, go to [email or web address].    Your next appointment:   6 month(s)  Provider:   Olga Millers, MD

## 2023-05-06 ENCOUNTER — Other Ambulatory Visit: Payer: Self-pay

## 2023-05-06 ENCOUNTER — Emergency Department (HOSPITAL_COMMUNITY)

## 2023-05-06 ENCOUNTER — Encounter (HOSPITAL_COMMUNITY): Payer: Self-pay | Admitting: *Deleted

## 2023-05-06 ENCOUNTER — Emergency Department (HOSPITAL_COMMUNITY)
Admission: EM | Admit: 2023-05-06 | Discharge: 2023-05-07 | Disposition: A | Discharge status: Home / Self Care | Attending: Emergency Medicine | Admitting: Emergency Medicine

## 2023-05-06 DIAGNOSIS — Z7982 Long term (current) use of aspirin: Secondary | ICD-10-CM | POA: Insufficient documentation

## 2023-05-06 DIAGNOSIS — J449 Chronic obstructive pulmonary disease, unspecified: Secondary | ICD-10-CM | POA: Diagnosis present

## 2023-05-06 DIAGNOSIS — Z79899 Other long term (current) drug therapy: Secondary | ICD-10-CM | POA: Insufficient documentation

## 2023-05-06 DIAGNOSIS — K5901 Slow transit constipation: Secondary | ICD-10-CM | POA: Insufficient documentation

## 2023-05-06 DIAGNOSIS — I110 Hypertensive heart disease with heart failure: Secondary | ICD-10-CM | POA: Diagnosis not present

## 2023-05-06 DIAGNOSIS — I509 Heart failure, unspecified: Secondary | ICD-10-CM | POA: Diagnosis not present

## 2023-05-06 DIAGNOSIS — R0602 Shortness of breath: Secondary | ICD-10-CM | POA: Diagnosis present

## 2023-05-06 DIAGNOSIS — R0789 Other chest pain: Secondary | ICD-10-CM | POA: Diagnosis present

## 2023-05-06 DIAGNOSIS — J471 Bronchiectasis with (acute) exacerbation: Secondary | ICD-10-CM | POA: Diagnosis not present

## 2023-05-06 LAB — BASIC METABOLIC PANEL WITH GFR
Anion gap: 8 (ref 5–15)
BUN: 12 mg/dL (ref 8–23)
CO2: 28 mmol/L (ref 22–32)
Calcium: 9 mg/dL (ref 8.9–10.3)
Chloride: 104 mmol/L (ref 98–111)
Creatinine, Ser: 0.83 mg/dL (ref 0.44–1.00)
GFR, Estimated: >60 mL/min (ref >60)
Glucose, Bld: 103 mg/dL — ABNORMAL HIGH (ref 70–99)
Potassium: 3 mmol/L — ABNORMAL LOW (ref 3.5–5.1)
Sodium: 140 mmol/L (ref 135–145)

## 2023-05-06 LAB — CBC
HCT: 35.7 % — ABNORMAL LOW (ref 36.0–46.0)
Hemoglobin: 11.4 g/dL — ABNORMAL LOW (ref 12.0–15.0)
MCH: 29.5 pg (ref 26.0–34.0)
MCHC: 31.9 g/dL (ref 30.0–36.0)
MCV: 92.2 fL (ref 80.0–100.0)
Platelets: 206 10*3/uL (ref 150–400)
RBC: 3.87 MIL/uL (ref 3.87–5.11)
RDW: 13.9 % (ref 11.5–15.5)
WBC: 7.5 10*3/uL (ref 4.0–10.5)
nRBC: 0 % (ref 0.0–0.2)

## 2023-05-06 LAB — TROPONIN I (HIGH SENSITIVITY): Troponin I (High Sensitivity): 22 ng/L — ABNORMAL HIGH (ref <18)

## 2023-05-06 NOTE — ED Triage Notes (Signed)
 The pt is having chest and abd pain for a week and she has difficulty breathing also  worse today

## 2023-05-07 ENCOUNTER — Emergency Department (HOSPITAL_COMMUNITY)

## 2023-05-07 DIAGNOSIS — J471 Bronchiectasis with (acute) exacerbation: Secondary | ICD-10-CM | POA: Diagnosis not present

## 2023-05-07 LAB — RESP PANEL BY RT-PCR (RSV, FLU A&B, COVID)  RVPGX2
Influenza A by PCR: NEGATIVE
Influenza B by PCR: NEGATIVE
Resp Syncytial Virus by PCR: NEGATIVE
SARS Coronavirus 2 by RT PCR: NEGATIVE

## 2023-05-07 LAB — BRAIN NATRIURETIC PEPTIDE: B Natriuretic Peptide: 315.8 pg/mL — ABNORMAL HIGH (ref 0.0–100.0)

## 2023-05-07 LAB — TROPONIN I (HIGH SENSITIVITY): Troponin I (High Sensitivity): 24 ng/L — ABNORMAL HIGH (ref <18)

## 2023-05-07 LAB — LIPASE, BLOOD: Lipase: 36 U/L (ref 11–51)

## 2023-05-07 LAB — MAGNESIUM: Magnesium: 2.2 mg/dL (ref 1.7–2.4)

## 2023-05-07 MED ORDER — IOHEXOL 350 MG/ML SOLN
50.0000 mL | Freq: Once | INTRAVENOUS | Status: AC | PRN
  Administered 2023-05-07: 50 mL via INTRAVENOUS

## 2023-05-07 MED ORDER — POLYETHYLENE GLYCOL 3350 17 GM/SCOOP PO POWD: 510.0000 g | Freq: Once | ORAL | 0 refills | Status: DC

## 2023-05-07 MED ORDER — PREDNISONE 10 MG PO TABS: 40.0000 mg | ORAL_TABLET | Freq: Every day | ORAL | 0 refills | Status: AC

## 2023-05-07 MED ORDER — PREDNISONE 20 MG PO TABS
60.0000 mg | ORAL_TABLET | Freq: Once | ORAL | Status: AC
  Administered 2023-05-07: 60 mg via ORAL
  Filled 2023-05-07: qty 3

## 2023-05-07 MED ORDER — AMOXICILLIN-POT CLAVULANATE 875-125 MG PO TABS: 1.0000 | ORAL_TABLET | Freq: Two times a day (BID) | ORAL | 0 refills | Status: DC

## 2023-05-07 MED ORDER — IPRATROPIUM-ALBUTEROL 0.5-2.5 (3) MG/3ML IN SOLN
3.0000 mL | Freq: Once | RESPIRATORY_TRACT | Status: AC
  Administered 2023-05-07: 3 mL via RESPIRATORY_TRACT
  Filled 2023-05-07: qty 3

## 2023-05-07 MED ORDER — POTASSIUM CHLORIDE CRYS ER 20 MEQ PO TBCR
40.0000 meq | EXTENDED_RELEASE_TABLET | Freq: Once | ORAL | Status: AC
  Administered 2023-05-07: 40 meq via ORAL
  Filled 2023-05-07: qty 2

## 2023-05-07 MED ORDER — POLYETHYLENE GLYCOL 3350 17 GM/SCOOP PO POWD: 17.0000 g | Freq: Every day | ORAL | 0 refills | Status: DC

## 2023-05-07 NOTE — ED Provider Notes (Signed)
 [facility] AT [facility] Provider Note   CSN: [id number] Arrival date & time: 05/06/23  2202     History  Chief Complaint  Patient presents with   Chest Pain   Abdominal Pain    Leverne Tessler is a 83 y.o. female.   Chest Pain Associated symptoms: abdominal pain, cough and shortness of breath   Abdominal Pain Associated symptoms: chest pain, cough and shortness of breath      83 year old female with medical history significant for bronchiectasis, CHF, COPD, HLD, HTN, presenting to the emergency department with multiple complaints.  The patient presents with her daughter who adds additional history.  She states that over the past week she has had increasing cough, difficulty breathing and some chest discomfort.  Additionally, she has been mildly constipated and has been having generalized abdominal discomfort.  No vomiting.  Pain comes and goes.  She also endorses chest discomfort.  No fevers or chills.  She is still on suppressive antibiotic therapy with azithromycin outpatient, follows outpatient with [facility] pulmonology however is looking to potentially switch to [facility] pulmonology.  Last saw Dr. 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  Her dyspnea chronically was thought to be secondary to pulmonary disease including bronchiectasis.  Home Medications Prior to Admission medications   Medication Sig Start Date End Date Taking? Authorizing Provider  amoxicillin -clavulanate (AUGMENTIN ) 875-125 MG tablet Take 1 tablet by mouth every 12 (twelve) hours. 05/07/23  Yes Rosealee Concha, MD  predniSONE  (DELTASONE ) 10 MG tablet Take 4 tablets  (40 mg total) by mouth daily for 5 days. 05/07/23 05/12/23 Yes Rosealee Concha, MD  albuterol  (VENTOLIN  HFA) 108 (90 Base) MCG/ACT inhaler Inhale 2 puffs into the lungs every 6 (six) hours as needed for wheezing or shortness of breath.    [provider]  alendronate (FOSAMAX) 70 MG tablet Take 70 mg by mouth once a week. 05/29/16   [provider]  aspirin  EC 81 MG tablet Take 1 tablet (81 mg total) by mouth daily. Swallow whole. 07/04/22   Etter Hermann., MD  atorvastatin (LIPITOR) 40 MG tablet Take 40 mg by mouth daily. 03/06/22 09/21/22  [provider]  azithromycin (ZITHROMAX) 250 MG tablet Take 250 mg by mouth 3 (three) times a week.    [provider]  Cranberry 200 MG CAPS Take 200 mg by mouth daily.    [provider]  dextromethorphan-guaiFENesin (MUCINEX DM) 30-600 MG 12hr tablet Take 1 tablet by mouth daily as needed for cough.    [provider]  furosemide (LASIX) 20 MG tablet Take 20 mg by mouth daily. 03/23/22   [provider]  HYDROcodone -acetaminophen  (NORCO) 5-325 MG per tablet Take 1-2 tablets by mouth every 6 (six) hours as needed. 11/09/13   Orvilla Blander, MD  ipratropium-albuterol  (DUONEB) 0.5-2.5 (3) MG/3ML SOLN Take 3 mLs by nebulization every 6 (six) hours as needed (shortness of breath). 07/03/22 09/01/22  Etter Hermann., MD  lisinopril (ZESTRIL) 20 MG tablet Take 20 mg by mouth daily.    [provider]  loratadine (CLARITIN) 10 MG tablet Take 10 mg by mouth daily as needed for allergies or rhinitis. 08/16/21   [provider]  Multiple Vitamin (MULTIVITAMIN WITH MINERALS) TABS tablet  Take 1 tablet by mouth daily.    [provider]  NON FORMULARY Take 1 tablet by mouth daily. [employer] - RingStop tablet.    [provider]  polyethylene glycol powder (GLYCOLAX /MIRALAX ) 17 GM/SCOOP powder Take 17 g by mouth daily. Take one capful daily 05/07/23   Rosealee Concha, MD   traZODone (DESYREL) 50 MG tablet Take 50 mg by mouth at bedtime. 05/29/16   [provider]  Caryle Class Oil (SOOTHE NIGHTTIME) OINT Place 1 Application into both eyes at bedtime.    [provider]      Allergies    Ciprofloxacin    Review of Systems   Review of Systems  Respiratory:  Positive for cough and shortness of breath.   Cardiovascular:  Positive for chest pain.  Gastrointestinal:  Positive for abdominal pain.  All other systems reviewed and are negative.   Physical Exam Updated Vital Signs BP (!) 144/76   Pulse 65   Temp 98.1 F (36.7 C) (Oral)   Resp 17   Ht 5' (1.524 m)   Wt 34.8 kg   SpO2 100%   BMI 14.98 kg/m  Physical Exam Vitals and nursing note reviewed.  Constitutional:      General: She is not in acute distress.    Appearance: She is well-developed.  HENT:     Head: Normocephalic and atraumatic.  Eyes:     Conjunctiva/sclera: Conjunctivae normal.  Cardiovascular:     Rate and Rhythm: Normal rate and regular rhythm.     Heart sounds: No murmur heard. Pulmonary:     Effort: Pulmonary effort is normal. No respiratory distress.     Breath sounds: Wheezing present.  Abdominal:     Palpations: Abdomen is soft.     Tenderness: There is abdominal tenderness. There is no guarding.  Musculoskeletal:        General: No swelling.     Cervical back: Neck supple.  Skin:    General: Skin is warm and dry.     Capillary Refill: Capillary refill takes less than 2 seconds.  Neurological:     Mental Status: She is alert.  Psychiatric:        Mood and Affect: Mood normal.     ED Results / Procedures / Treatments   Labs (all labs ordered are listed, but only abnormal results are displayed) Labs Reviewed  BASIC METABOLIC PANEL WITH GFR - Abnormal; Notable for the following components:      Result Value   Potassium 3.0 (*)    Glucose, Bld 103 (*)    All other components within normal limits  CBC - Abnormal; Notable for the  following components:   Hemoglobin 11.4 (*)    HCT 35.7 (*)    All other components within normal limits  BRAIN NATRIURETIC PEPTIDE - Abnormal; Notable for the following components:   B Natriuretic Peptide 315.8 (*)    All other components within normal limits  TROPONIN I (HIGH SENSITIVITY) - Abnormal; Notable for the following components:   Troponin I (High Sensitivity) 22 (*)    All other components within normal limits  TROPONIN I (HIGH SENSITIVITY) - Abnormal; Notable for the following components:   Troponin I (High Sensitivity) 24 (*)    All other components within normal limits  RESP PANEL BY RT-PCR (RSV, FLU A&B, COVID)  RVPGX2  MAGNESIUM  LIPASE, BLOOD    EKG EKG Interpretation Date/Time:  Sunday May 06 2023 22:14:08 EDT Ventricular Rate:  86 PR Interval:  168  QRS Duration:  80 QT Interval:  356 QTC Calculation: 426 R Axis:   70  Text Interpretation: Normal sinus rhythm Septal infarct , age undetermined ST & T wave abnormality, consider inferior ischemia ST & T wave abnormality, consider anterolateral ischemia Abnormal ECG When compared with ECG of 10-Apr-2023 15:54, No significant change since last tracing Confirmed by Trish Furl [phone]) on 05/07/2023 12:29:02 PM  Radiology CT Angio Chest PE W and/or Wo Contrast Result Date: 05/07/2023 CLINICAL DATA:  Chest and abdominal pain with difficulty breathing. EXAM: CT ANGIOGRAPHY CHEST CT ABDOMEN AND PELVIS WITH CONTRAST TECHNIQUE: Multidetector CT imaging of the chest was performed using the standard protocol during bolus administration of intravenous contrast. Multiplanar CT image reconstructions and MIPs were obtained to evaluate the vascular anatomy. Multidetector CT imaging of the abdomen and pelvis was performed using the standard protocol during bolus administration of intravenous contrast. RADIATION DOSE REDUCTION: This exam was performed according to the departmental dose-optimization program which includes automated  exposure control, adjustment of the mA and/or kV according to patient size and/or use of iterative reconstruction technique. CONTRAST:  50mL OMNIPAQUE  IOHEXOL  350 MG/ML SOLN COMPARISON:  CT chest 06/22/2022 FINDINGS: CTA CHEST FINDINGS Cardiovascular: Satisfactory opacification of the pulmonary arteries to the segmental level. No evidence of pulmonary embolism. Normal heart size. No pericardial effusion. Aortic atherosclerosis. Coronary artery calcification. Mediastinum/Nodes: Thyroid gland, trachea and esophagus are unremarkable. Some retained secretions are noted along the posterior wall of the proximal trachea. No enlarged mediastinal or hilar lymph nodes. Lungs/Pleura: No pleural effusion. Diffuse bronchial wall thickening. Diffuse peribronchovascular nodularity, peribronchial thickening, mucoid impaction and bronchiectasis is again noted, similar to 04/19/2022. Calcified granuloma is again seen within the superior segment of the left lower lobe. No pleural effusion or lobar consolidation. Musculoskeletal: Osteopenia. No acute or suspicious osseous findings. Degenerative changes identified within the thoracic spine. Review of the MIP images confirms the above findings. CT ABDOMEN and PELVIS FINDINGS Hepatobiliary: No focal liver abnormality is seen. No gallstones, gallbladder wall thickening, or biliary dilatation. Pancreas: Unremarkable. No pancreatic ductal dilatation or surrounding inflammatory changes. Spleen: Normal in size without focal abnormality. Adrenals/Urinary Tract: Normal adrenal glands. No nephrolithiasis, hydronephrosis or suspicious mass. Urinary bladder appears within normal limits. Stomach/Bowel: Stomach appears normal. No pathologic dilatation of the large or small bowel loops. Within the right lower quadrant of the abdomen there are fecalized distal small bowel loops up to the cecum. Moderate stool burden noted within the ascending colon with mild retained stool elsewhere. No bowel wall  thickening or inflammation. The appendix is suboptimally visualized separate from the right abdominal bowel loops. No secondary signs of acute appendicitis noted however. Vascular/Lymphatic: Aortic atherosclerosis. No aneurysm. Upper abdominal vascularity is patent. No signs of abdominopelvic adenopathy. Reproductive: Uterus and bilateral adnexa are unremarkable. Other: No free fluid or fluid collections. No signs of pneumoperitoneum. Musculoskeletal: Osteopenia. Curvature of the lumbar spine is convex towards the right. No acute or suspicious osseous findings. Review of the MIP images confirms the above findings. IMPRESSION: 1. No evidence for acute pulmonary embolus. 2. Diffuse peribronchovascular nodularity, peribronchial thickening, mucoid impaction and bronchiectasis is again noted, similar to 04/19/2022. 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 3. Fecalized distal small bowel loops up to the cecum. Moderate stool burden noted within the ascending colon with mild retained stool elsewhere. Findings are favored to represent slow transit. 4. Coronary artery calcifications. 5.  Aortic Atherosclerosis (ICD10-I70.0). Electronically Signed   By: Kimberley Penman M.D.   On: 05/07/2023  05:38   CT ABDOMEN PELVIS W CONTRAST Result Date: 05/07/2023 CLINICAL DATA:  Chest and abdominal pain with difficulty breathing. EXAM: CT ANGIOGRAPHY CHEST CT ABDOMEN AND PELVIS WITH CONTRAST TECHNIQUE: Multidetector CT imaging of the chest was performed using the standard protocol during bolus administration of intravenous contrast. Multiplanar CT image reconstructions and MIPs were obtained to evaluate the vascular anatomy. Multidetector CT imaging of the abdomen and pelvis was performed using the standard protocol during bolus administration of intravenous contrast. RADIATION DOSE REDUCTION: This exam was performed according to the departmental dose-optimization program which includes automated  exposure control, adjustment of the mA and/or kV according to patient size and/or use of iterative reconstruction technique. CONTRAST:  50mL OMNIPAQUE  IOHEXOL  350 MG/ML SOLN COMPARISON:  CT chest 06/22/2022 FINDINGS: CTA CHEST FINDINGS Cardiovascular: Satisfactory opacification of the pulmonary arteries to the segmental level. No evidence of pulmonary embolism. Normal heart size. No pericardial effusion. Aortic atherosclerosis. Coronary artery calcification. Mediastinum/Nodes: Thyroid gland, trachea and esophagus are unremarkable. Some retained secretions are noted along the posterior wall of the proximal trachea. No enlarged mediastinal or hilar lymph nodes. Lungs/Pleura: No pleural effusion. Diffuse bronchial wall thickening. Diffuse peribronchovascular nodularity, peribronchial thickening, mucoid impaction and bronchiectasis is again noted, similar to 04/19/2022. Calcified granuloma is again seen within the superior segment of the left lower lobe. No pleural effusion or lobar consolidation. Musculoskeletal: Osteopenia. No acute or suspicious osseous findings. Degenerative changes identified within the thoracic spine. Review of the MIP images confirms the above findings. CT ABDOMEN and PELVIS FINDINGS Hepatobiliary: No focal liver abnormality is seen. No gallstones, gallbladder wall thickening, or biliary dilatation. Pancreas: Unremarkable. No pancreatic ductal dilatation or surrounding inflammatory changes. Spleen: Normal in size without focal abnormality. Adrenals/Urinary Tract: Normal adrenal glands. No nephrolithiasis, hydronephrosis or suspicious mass. Urinary bladder appears within normal limits. Stomach/Bowel: Stomach appears normal. No pathologic dilatation of the large or small bowel loops. Within the right lower quadrant of the abdomen there are fecalized distal small bowel loops up to the cecum. Moderate stool burden noted within the ascending colon with mild retained stool elsewhere. No bowel wall  thickening or inflammation. The appendix is suboptimally visualized separate from the right abdominal bowel loops. No secondary signs of acute appendicitis noted however. Vascular/Lymphatic: Aortic atherosclerosis. No aneurysm. Upper abdominal vascularity is patent. No signs of abdominopelvic adenopathy. Reproductive: Uterus and bilateral adnexa are unremarkable. Other: No free fluid or fluid collections. No signs of pneumoperitoneum. Musculoskeletal: Osteopenia. Curvature of the lumbar spine is convex towards the right. No acute or suspicious osseous findings. Review of the MIP images confirms the above findings. IMPRESSION: 1. No evidence for acute pulmonary embolus. 2. Diffuse peribronchovascular nodularity, peribronchial thickening, mucoid impaction and bronchiectasis is again noted, similar to 04/19/2022. 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 3. Fecalized distal small bowel loops up to the cecum. Moderate stool burden noted within the ascending colon with mild retained stool elsewhere. Findings are favored to represent slow transit. 4. Coronary artery calcifications. 5.  Aortic Atherosclerosis (ICD10-I70.0). Electronically Signed   By: Kimberley Penman M.D.   On: 05/07/2023 05:38   DG Chest 2 View Result Date: 05/06/2023 CLINICAL DATA:  Shortness of breath, chest and abdominal pain EXAM: CHEST - 2 VIEW COMPARISON:  04/06/2023 FINDINGS: Hyperinflation and chronic bronchitic change. Stable cardiomediastinal silhouette. Aortic atherosclerotic calcification. Scattered nodular opacities are similar to prior. Unchanged spiculated nodule in the left upper lobe measuring approximately 10 mm. No focal consolidation, pleural effusion, or pneumothorax. No displaced rib  fractures. IMPRESSION: Multiple bilateral pulmonary nodules similar to prior imaging consistent with atypical infection. COPD. Electronically Signed   By: Rozell Cornet M.D.   On: 05/06/2023 23:26     Procedures Procedures    Medications Ordered in ED Medications  potassium chloride  SA (KLOR-CON  M) CR tablet 40 mEq (40 mEq Oral Given 05/07/23 0210)  ipratropium-albuterol  (DUONEB) 0.5-2.5 (3) MG/3ML nebulizer solution 3 mL (3 mLs Nebulization Given 05/07/23 0258)  iohexol  (OMNIPAQUE ) 350 MG/ML injection 50 mL (50 mLs Intravenous Contrast Given 05/07/23 0431)  predniSONE  (DELTASONE ) tablet 60 mg (60 mg Oral Given 05/07/23 2956)    ED Course/ Medical Decision Making/ A&P                                 Medical Decision Making Amount and/or Complexity of Data Reviewed Labs: ordered. Radiology: ordered.  Risk Prescription drug management.    83 year old female with medical history significant for bronchiectasis, CHF, COPD, HLD, HTN, presenting to the emergency department with multiple complaints.  The patient presents with her daughter who adds additional history.  She states that over the past week she has had increasing cough, difficulty breathing and some chest discomfort.  Additionally, she has been mildly constipated and has been having generalized abdominal discomfort.  No vomiting.  Pain comes and goes.  She also endorses chest discomfort.  No fevers or chills.  She is still on suppressive antibiotic therapy with azithromycin outpatient, follows outpatient with [facility] pulmonology however is looking to potentially switch to [facility] pulmonology.  Last saw Dr. 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  Her dyspnea chronically was thought to be secondary to pulmonary disease including bronchiectasis.  On arrival, the patient was afebrile, not tachycardic, mildly  tachypneic RR 24, BP 189/77, saturating 100% on room air.  On exam the patient had mild expiratory wheezing, generalized abdominal tenderness on exam.  Concern for bronchiectasis exacerbation that appears to be somewhat mild.  Additionally considered constipation, bowel obstruction, other acute intra-abdominal abnormality.  Considered PE, lower concern for ACS.  Considered pneumonia, viral infection.  EKG: Sinus rhythm, ventricular rate 86, nonspecific ST changes, no STEMI.  A chest x-ray was performed which revealed multiple bilateral pulmonary nodules similar to prior consistent with atypical infection, COPD.  The patient was administered oral prednisone , administered a DuoNeb with subsequent improvement.  Will evaluate further with CT imaging to rule out PE or other acute pulmonary abnormality, will obtain CT of the abdomen pelvis as well given the patient's abdominal discomfort.  Laboratory evaluation revealed BMP with hypokalemia to 3.0, magnesium normal, patient's potassium was replenished orally, no other electrolyte abnormality, CBC without a leukocytosis, mild anemia at 11.4 which appears stable, BNP nonspecifically moderately elevated at 316, lipase normal, COVID, flu, RSV PCR testing negative, troponins mildly elevated but flat at 22 and 24.  On repeat evaluation, following DuoNeb treatment the patient was feeling symptomatically improved.  She has no oxygen requirement and is overall well-appearing and is vitally stable.  CTA imaging and CT abdomen pelvis: IMPRESSION:  1. No evidence for acute pulmonary embolus.  2. Diffuse peribronchovascular nodularity, peribronchial thickening,  mucoid impaction and bronchiectasis is again noted, similar to  04/19/2022. Findings are favored  to represent chronic atypical  infection such as MAI.  3. Fecalized distal small bowel loops up to the cecum. Moderate  stool burden noted within the ascending colon with mild retained  stool elsewhere. Findings  are favored to represent slow transit.  4. Coronary artery calcifications.  5.  Aortic Atherosclerosis (ICD10-I70.0).    For the patient's constipation, recommended daily MiraLAX  and hydration, outpatient follow-up.  Regarding the patient's bronchiectasis, suspect mild exacerbation, considered inpatient admission versus discharge and will trial a course of discharge with outpatient antibiotics and steroids at this time.  Prednisone  prescribed, will trial Augmentin  on top of the patient's azithromycin therapy as the patient has a known allergy to fluoroquinolones.  Patient would like to follow-up with a pulmonologist in [facility], advised outpatient follow-up with [place] [facility], return precautions provided, all questions answered prior to discharge, stable for discharge at this time.  Final Clinical Impression(s) / ED Diagnoses Final diagnoses:  Bronchiectasis with acute exacerbation (HCC)  Slow transit constipation    Rx / DC Orders ED Discharge Orders          Ordered    polyethylene glycol powder (GLYCOLAX /MIRALAX ) 17 GM/SCOOP powder   Once,   Status:  Discontinued        05/07/23 0745    polyethylene glycol powder (GLYCOLAX /MIRALAX ) 17 GM/SCOOP powder  Daily        05/07/23 0745    predniSONE  (DELTASONE ) 10 MG tablet  Daily        05/07/23 0746    amoxicillin -clavulanate (AUGMENTIN ) 875-125 MG tablet  Every 12 hours        05/07/23 0746              Rosealee Concha, MD 05/07/23 2325

## 2023-05-07 NOTE — ED Notes (Signed)
 Patient transported to CT

## 2023-05-07 NOTE — Discharge Instructions (Addendum)
 Your CT of the abdomen revealed evidence of constipation.  Treat the constipation with MiraLAX  1 capful daily.  Chest CT revealed evidence of bronchiectasis.  We will discharge you on a course of steroids, continue your outpatient prescribed antibiotic therapy and we will add an additional antibiotic to your regimen for the next few days.  Please follow-up outpatient with pulmonology, return for any severe worsening symptoms

## 2023-06-04 NOTE — ED Provider Notes (Signed)
 Patient placed in [facility] pathway, seen and evaluated for chief complaint of HA, fatigue, weakness, bilateral ear pain x 1 week. Pertinent exam findings include non-toxic in appearance. Based on initial evaluation, labs are currently indicated and radiology studies are currently indicated as allowed for current processes and treatments as applicable in a triage setting and could be different than if patient were seen in a main treatment area or dependent on labs/imagining after results are displayed.  Patient counseled on process, plan, and necessity for staying for completing the evaluation.   This document serves as a record of services personally performed by Tamea Ford PA-C.    Emergency Department Provider Note   Documentation assistance was provided by Karena LOISE Hurst Scribe, on 4:31 PM 06/04/2023 for Dorn Hoover, M.D.   Provider at bedside: 4:31 PM  History obtained from the: Patient and Relative  History   Chief Complaint  Patient presents with   Fatigue   Headache      History provided by:  Patient and relative Language interpreter used: No    Patient here with relative who assisted with patient history after patient refused Lao interpreter. Patient endorses decreased hearing and possible ringing in the ears that makes her headache more severe to the point where she is crying in pain. He reports that the patient has been weak and fatigued for the past week as well and had a fever. Patient reports some blurry vision when she stands up from a sitting position. Relative reports that the patient was seen at an ED about a month ago for pain in her chest and had a negative cardiac workup. He also states that the patient was admitted to the hospital for 4 days due to pneumonia about 11 months ago. Patient denies any diarrhea or vomiting.  No LMP recorded. Patient is postmenopausal.   Past Medical History Past Medical History:  Diagnosis Date   Acute on chronic congestive  heart failure    (CMD) 10/25/2020   Bronchiectasis    (CMD)    Hyperlipidemia    Hypertension    Stage 3a chronic kidney disease (CMD) 11/18/2021    Past Surgical History Past Surgical History:  Procedure Laterality Date   NO PAST SURGERIES     Procedure: NO PAST SURGERIES     Medications Prior to Admission medications   Medication Sig Start Date End Date Taking? Authorizing Provider  alendronate (FOSAMAX) 70 mg tablet Take 1 tablet (70 mg total) by mouth once a week. In morning w/ full glass of water on an empty stomach,don't lie down x30 Min 02/23/23   Garrel Dwane Cristal, MD  aspirin  81 mg EC tablet Take 81 mg by mouth daily.    HISTORICAL PROVIDER, CONVERSION  atorvastatin (LIPITOR) 40 mg tablet TAKE 1 TABLET(40 MG) BY MOUTH DAILY 04/23/23   Garrel Dwane Cristal, MD  azithromycin (ZITHROMAX) 250 mg tablet Take 1 tablet (250 mg total) by mouth 3 (three) times a week. 11/24/22 11/23/23  Prentice Lonni Graven, PA-C  furosemide  (LASIX ) 20 mg tablet Take 1 tablet (20 mg total) by mouth daily. 02/23/23   Garrel Dwane Cristal, MD  ipratropium-albuteroL  (DUO-NEB) 0.5-2.5 mg/3 mL nebulizer solution Take 3 mL by nebulization every 6 (six) hours. 11/24/22 11/24/23  Prentice Lonni Graven, PA-C  lisinopriL (PRINIVIL) 40 mg tablet Take 1 tablet (40 mg total) by mouth daily. 05/25/23   Garrel Dwane Cristal, MD  loratadine (CLARITIN) 10 mg tablet Take 10 mg by mouth Once Daily. 08/16/21   HISTORICAL PROVIDER,  CONVERSION  multivitamin with folic acid 400 mcg tab Take 1 tablet by mouth Once Daily. 10/20/20   HISTORICAL PROVIDER, CONVERSION  traZODone (DESYREL) 50 mg tablet Take 1 tablet (50 mg total) by mouth nightly as needed for sleep. 03/15/23   Garrel Dwane Cristal, MD  white petrolatum-mineral oiL (Nighttime Dry-Eye Relief) 57.3-42.5 % oint Administer 1 Application into each eyes nightly as needed. 11/04/20   HISTORICAL PROVIDER, CONVERSION     Allergies Allergies  Allergen Reactions   Ciprofloxacin Rash      Family History Family History  Problem Relation Name Age of Onset   Diabetes Brother     Stroke Brother     Diabetes Maternal Grandmother       Social History Social History   Tobacco Use   Smoking status: Never   Smokeless tobacco: Never  Vaping Use   Vaping status: Never Used  Substance Use Topics   Alcohol use: No   Drug use: Never     Review of Systems  Review of Systems  Constitutional:  Positive for fever. Negative for chills.  HENT:  Positive for ear pain. Negative for congestion, sneezing, sore throat and trouble swallowing.   Eyes:  Positive for visual disturbance.  Respiratory:  Positive for cough and shortness of breath. Negative for chest tightness and wheezing.   Cardiovascular:  Positive for chest pain. Negative for leg swelling.  Gastrointestinal:  Negative for abdominal pain, diarrhea, nausea and vomiting.  Genitourinary:  Negative for dysuria and flank pain.  Musculoskeletal:  Negative for arthralgias and myalgias.  Skin:  Negative for rash.  Neurological:  Positive for weakness and headaches. Negative for numbness.  Psychiatric/Behavioral:  Negative for suicidal ideas.      Physical Exam   Vitals:   06/04/23 1307 06/04/23 1715  BP: (!) 167/74 (!) 131/59  BP Location: Right arm   Patient Position: Sitting   Pulse: 72 80  Resp: 18 16  Temp: 97.9 F (36.6 C)   TempSrc: Oral   SpO2: 97% 100%    Physical Exam Constitutional:      Appearance: Normal appearance. She is underweight.  HENT:     Head: Normocephalic and atraumatic.  Eyes:     Extraocular Movements: Extraocular movements intact.     Pupils: Pupils are equal, round, and reactive to light.  Cardiovascular:     Rate and Rhythm: Normal rate and regular rhythm.     Heart sounds: Normal heart sounds.  Pulmonary:     Effort: Pulmonary effort is normal.     Breath sounds: Normal breath sounds.  Abdominal:     General: Bowel sounds are normal.     Palpations: Abdomen  is soft.  Musculoskeletal:        General: Normal range of motion.     Cervical back: Normal range of motion.  Skin:    General: Skin is warm and dry.  Neurological:     Mental Status: She is alert.      Previous Chart Review   Reviewed internal medicine office visit from 05/25/2023 for mixed HLN, HTN, CKD stage 3, CHF, and SVT follow-up.   Differential Diagnosis   Intracranial hemorrhage, CVA, TIA, metabolic abnormality, occult infection, UTI, ACS, PE  Cardiac Monitor Interpretation   Heart rate 72, blood pressure 167/74, respiratory rate 18 at 97% on RA.   EKG Interpretation and HEAR Score  None    Initial Radiology Interpretation & Consideration   My initial interpretation of the patient's head CT: No acute process  Radiology reads of all radiologic studies have been reviewed.  Interventions   Patient given IV fluids.   Lab Interpretation   CBC is unremarkable.  Troponins are 18 respectively.  BNP is 108.  Magnesium is 2.1.  CMP shows sodium 125, potassium 4.7, chloride 93.  Procedure Note  Procedures   Consults   06/04/2023 7:31 PM Case discussed with Lyle Deems with CDU APP who will come see patient for admission.   MDM   ED Course as of 06/04/23 1931  Mon Jun 04, 2023  8070 Patient presented with vague complaints including some headache, dizziness type sensation as well as chest and abdominal discomfort.  Her workup here is benign except for sodium 125.  She has slightly elevated troponins at 18.  She was treated with IV fluids and I will have her admitted to the CDU for sodium correction.  The patient was at [facility] about a month ago and had a CT of the chest and abdomen pelvis done which were nonacute.  I do not think she needs repeat imaging today. [JW]    ED Course User Index [JW] Dorn Arloa Hoover, MD     Clinical Complexity  Patient's presentation is most consistent with acute presentation with potential threat to life or bodily  function.  Provider time spent in patient care today, inclusive of but not limited to clinical reassessment, review of diagnostic studies, and discharge preparation, was greater than 30 minutes.   ED Clinical Impression   1. Hyponatremia     ED Disposition   Admit to the CDU for observation.  _______________________________________________________________________   This document serves as a record of services personally performed by Dorn Hoover, MD. It was created on their behalf by Karena LOISE Hurst, Scribe, a trained medical scribe. The creation of this record is the providers dictation and/or activities during the visit.   Electronically signed by: Karena LOISE Hurst, Scribe 06/04/2023 7:31 PM

## 2023-06-14 NOTE — Progress Notes (Signed)
 " Patient ID: Heidi Spence is a 83 y.o. female who presents for  Chief Complaint  Patient presents with   Chronic Care Management  .  Informant: Self and Daughter   Assessment/Plan:    1- Dizziness, headache, ear pain, ? Meunier's disease, f/u with ENT as planned. 2- Continue current meds. 3- F/U as needed and planned.  Patient Active Problem List  Diagnosis   Abnormal loss of weight   Depressive disorder   Essential hypertension   Fall in elderly patient   Fatigue   Fracture of distal end of left radius   Generalized osteoarthrosis, involving multiple sites   Mixed hyperlipidemia   Insomnia   Osteoporosis   Trigeminal neuralgia   Vitamin D deficiency   Bigeminy   Chest discomfort   Abnormal EKG   Abnormal nuclear stress test   Bradycardia   Sensorineural hearing loss (SNHL) of both ears   Tinnitus of both ears   Abnormal CT of the chest   Pulmonary nodule, right   Bronchiectasis without complication (HCC)   SVT (supraventricular tachycardia)   Palpitations   Non-seasonal allergic rhinitis   Healthcare maintenance   Hyponatremia   Multifocal pneumonia   CHF exacerbation (HCC)   Chest pain syndrome   Chronic systolic congestive heart failure (HCC)   Stage 3a chronic kidney disease (HCC)   Influenza B   Sensation of fullness in both ears   Prediabetes   Dizziness   Vertigo    Problem List Items Addressed This Visit     Dizziness   Fatigue - Primary   Hyponatremia   Sensorineural hearing loss (SNHL) of both ears   Vertigo    Return if symptoms worsen or fail to improve.   Subjective:   HPI  Patient is an 83 y/o female with h/o HTN, Hyperlipidemia, Chronic Systolic CHF, SVT, Bradycardia, CKD Stage 3a, Pulmonary Nodule, Bronchiectasis, Chest Discomfort, Trigeminal Neuralgia, Osteoporosis, Hyponatremia, Vitamin D Deficiency, Allergic Rhinitis, Bilat. Hearing Loss, Bilat. Tinnitus, Insomnia, Depression and weight  loss, presenting for post admission visit. Patient reported to ED, on 06/04/2023, with CC of dizziness, bilat ear pain, headache and tinnitus and was admitted for further evaluation. On admission was found to be hyponatremic, had negative CT of head, MRI of brain was positive for chronic lacunar infarct with no acute findings, CT angio of brain showed no acute findings. 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 On questioning, headaches and dizziness have improved, has an upcoming ENT appt.  ROS  Review of Systems  Respiratory: Negative.    Cardiovascular: Negative.   Neurological:  Positive for dizziness and headaches.    Outpatient Medications Prior to Visit  Medication Sig Dispense Refill   alendronate (FOSAMAX) 70 mg tablet Take 1 tablet (70 mg total) by mouth once a week. In morning w/ full glass of water on an empty stomach,don't lie down x30 Min 12 tablet 1   aspirin  81 mg EC tablet Take 81 mg by mouth daily.     atorvastatin (LIPITOR) 40 mg tablet TAKE 1 TABLET(40 MG) BY MOUTH DAILY 90 tablet 1   azithromycin (ZITHROMAX) 250 mg tablet Take 1 tablet (250 mg total) by mouth 3 (three) times a week. 12 tablet 11   calcium  carbonate-vitamin D3 (OYSTER SHELL) 250 mg-3.125 mcg (125 unit) tab per tablet Take 1 tablet by mouth daily.     fluticasone  propionate (FLONASE ) 50 mcg/spray nasal spray Administer 1 spray into each nostril 2 (two) times a day as  needed for rhinitis.     furosemide  (LASIX ) 20 mg tablet Take 1 tablet (20 mg total) by mouth daily. 90 tablet 1   gabapentin (NEURONTIN) 100 mg capsule Take 1 capsule (100 mg total) by mouth 3 (three) times a day. 270 capsule 1   guaiFENesin (MUCINEX) 600 mg 12 hr tablet Take 1,200 mg by mouth 2 (two) times a day as needed for cough.     ipratropium-albuteroL  (DUO-NEB) 0.5-2.5 mg/3 mL nebulizer solution Take 3 mL by nebulization every 6 (six) hours. 360 mL 11   lisinopriL (PRINIVIL) 40  mg tablet Take 1 tablet (40 mg total) by mouth daily. 90 tablet 1   mirtazapine (REMERON) 7.5 mg tablet Take 1 tablet (7.5 mg total) by mouth at bedtime. 90 tablet 0   multivitamin with folic acid 400 mcg tab Take 1 tablet by mouth Once Daily.     polyethylene glycol (MIRALAX ) 17 gram powd powder Take 17 g by mouth daily as needed for constipation.     white petrolatum-mineral oiL (Nighttime Dry-Eye Relief) 57.3-42.5 % oint Administer 1 Application into each eyes nightly as needed.     No facility-administered medications prior to visit.    The following portions of the patient's history were reviewed and updated as appropriate: allergies, current medications, past medical history, past social history, family history and problem list.  Preventive Care Reviewed in EMR Health Maintenance Activity   Objective:    Vital Signs BP 139/73 (BP Location: Left arm, Patient Position: Sitting)   Pulse 75   Temp 97.3 F (36.3 C) (Temporal)   Resp 16   Ht 1.473 m (4' 10)   Wt 34.5 kg (76 lb)   SpO2 95%   BMI 15.88 kg/m    Wt Readings from Last 3 Encounters:  06/14/23 34.5 kg (76 lb)  06/08/23 34.5 kg (76 lb)  06/05/23 33.4 kg (73 lb 11.2 oz)    Exam General: WD, frail female in NAD. EYES: PERRL, anicteric sclerae. RESP: Relaxed respiratory effort. Clear to auscultation without wheezes or crackles.  CV: Regular rate and rhythm. Normal S1 and S2. No murmurs or gallops. No lower extremity edema.  GI: Soft, NT, no organomegaly, normoactive BS. NEURO: Alert & Oriented X 3, normal gait and coordination. Psych: Normal mood and affect.  "

## 2023-06-14 NOTE — Progress Notes (Signed)
 Pt (accompanied by her daughter) is here for routine follow up. Her daughter is concerned about pt's memory loss, constipation issues along with ongoing headaches.

## 2023-07-02 NOTE — Progress Notes (Signed)
 [facility] Population [facility] Program Closure  ACN unable to reach patient during 30-day post TCM period.  TCM program closed.

## 2023-07-17 NOTE — Progress Notes (Signed)
 Otolaryngology Office Visit Note  Seen for evaluation and concern for a   ear    problem.   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 This seems to be holding her pain. She can eat, talk and she is comfortable.  She wears hearing aids. She had some tinnitus but her daughter feels she is improving.  She has imbalance with her walking. No vertigo. She feels lightheaded. She has headaches and she is dizzy.   Exam: bilateral ear canals are clear. Tms are clear.   Plan: she has never had vertigo, so not sure concern for Meniere's as that causes vertigo. She does have dizziness more consistent with a vestibular migraine. Discussed with her daughter today a trial of increasing her Gabapentin to 900 TiD slowly adding one dose every ten days if tolerated. If not tolerated, wait another ten days at current dose before trying again to add next dose.  Follow up as needed.         MRI Brain WO Contrast Status: Final result   Study Result  Narrative & Impression  CLINICAL DATA:  Provided history: Dizziness, persistent/recurrent, cardiac or vascular cause suspected.   EXAM: MRI HEAD WITHOUT CONTRAST   TECHNIQUE: Multiplanar, multiecho pulse sequences of the brain and surrounding structures were obtained without intravenous contrast.   COMPARISON:  Head CT 06/04/2023.   FINDINGS: Intermittently motion degraded examination. Most notably, the least motion degraded axial T1 sequence is severely motion degraded and the least motion degraded axial SWI sequence is moderately motion degraded. Within this limitation, findings are as follows.   Brain:   Mild generalized cerebral atrophy.   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   Minimal multifocal T2 FLAIR hyperintense  signal abnormality elsewhere within the cerebral white matter, nonspecific but compatible with chronic small vessel ischemic disease.   There is no acute infarct.   No evidence of an intracranial mass.   No chronic intracranial blood products.   No extra-axial fluid collection.   No midline shift.   Vascular: Maintained flow voids within the proximal large arterial vessels.   Skull and upper cervical spine: No focal worrisome marrow lesion.   Sinuses/Orbits: No mass or acute finding within the imaged orbits. Prior bilateral ocular lens replacement. Mild mucosal thickening or small mucous retention cysts within the left maxillary sinus.   IMPRESSION: 1. Intermittently motion degraded examination. 2. No evidence of an acute intracranial abnormality. The diffusion-weighted imaging is of good quality and there is no evidence of an acute or recent subacute infarction. 3. Chronic lacunar infarct within the posterior limb of left internal capsule. 4. Minor background cerebral white matter chronic small vessel ischemic disease. 5. Mild generalized cerebral atrophy. 6. Mild left maxillary sinus disease, as described.     Electronically Signed   By: Rockey Childs D.O.   On: 06/05/2023 11:49        HPI Chief Complaint  Patient presents with   Follow-up    Pt is an established pt who is here today for follow up.  She states her ears seem better but is more concerned about the tinnitus, head pressure and headaches.  She has been using Gabapentin 200 mg TID which seems to be helping.  Hospital was concerned about Meniere's.  Pt  will intermittently get tears with mucus from the eyes with the headaches, although that seems to be improving per her daughter.  Pt wants to r/o any ear fluid or infection that could be contributing to her SNHL.  She was last    .    Seen on 12/21/22 for SNHL and bilateral tinnitus.  CTA Brain and MRI Brain 06/05/23 and CT Head on 06/04/23.    ROS See  HPI  Allergies Allergies[1]  Current Meds Current Medications[2]  Active Problems Problem List[3]  PSH Surgical History[4]  Family Hx Family History[5]  Social Hx Social History   Socioeconomic History   Marital status: Widowed    Spouse name: Not on file   Number of children: Not on file   Years of education: Not on file   Highest education level: Not on file  Occupational History   Not on file  Tobacco Use   Smoking status: Never   Smokeless tobacco: Never  Vaping Use   Vaping status: Never Used  Substance and Sexual Activity   Alcohol use: No   Drug use: Never   Sexual activity: Not Currently  Other Topics Concern   Not on file  Social History Narrative   Not on file   Social Drivers of Health   Food Insecurity: Low Risk  (06/08/2023)   Food vital sign    Within the past 12 months, you worried that your food would run out before you got money to buy more: Never true    Within the past 12 months, the food you bought just didn't last and you didn't have money to get more: Never true  Transportation Needs: No Transportation Needs (06/08/2023)   Transportation    In the past 12 months, has lack of reliable transportation kept you from medical appointments, meetings, work or from getting things needed for daily living? : No  Safety: Low Risk  (07/17/2023)   Safety    How often does anyone, including family and friends, physically hurt you?: Never    How often does anyone, including family and friends, insult or talk down to you?: Never    How often does anyone, including family and friends, threaten you with harm?: Never    How often does anyone, including family and friends, scream or curse at you?: Never  Living Situation: Low Risk  (06/08/2023)   Living Situation    What is your living situation today?: I have a steady place to live    Think about the place you live. Do you have problems with any of the following? Choose all that apply::  None/None on this list    Vital Signs Vitals:   07/17/23 1100  BP: 116/73  Pulse: 69  Temp: 98 F (36.7 C)    Physical Exam   EXAMINATION  Constitutional General Appearance: Well appearing in no acute distress Ability to Communicate: Age appropriate Pain: None at this time  Head & Face Inspection:  Normocephalic  Eyes Ocular Motility: Extraocular muscles intact   Ears Right EXT: Normal Right ZJR:rozjm Right TM: clear and mobile Left EXT: Normal Left EAC: clear Left TM:  clear and mobile     Nose Ext Nose: Dorsum midline Intranasal: no lesions or purulence  Oral Cavity, Mouth  Pharynx Lips/Teeth/Gums:  Oral Mucosa:  Tongue/Floor of Mouth:  Palate/Uvula: clear, no exudates, vesicles, or erythema  Tonsils: non obstructive Posterior Pharynx:  Pharyngeal Walls:  Larynx:  Nasopharynx:   Cervical Neck: Normal appearance Thyroid:   Lymphatic Neck  Nodes:    Respiratory/Cardiac      Neuro/Psych Cranial Nerves:  Orientation: Age appropriate Mood & Affect: Age appropriate  GI/GU   Skin No facial lesions  Musculoskeletal   Extremities    PROCEDURE:      Odella HERO. Dittmer, PAC         [1] Allergies Allergen Reactions   Ciprofloxacin Rash  [2]  Current Outpatient Medications:    alendronate (FOSAMAX) 70 mg tablet, Take 1 tablet (70 mg total) by mouth once a week. In morning w/ full glass of water on an empty stomach,don't lie down x30 Min, Disp: 12 tablet, Rfl: 1   aspirin  81 mg EC tablet, Take 81 mg by mouth daily., Disp: , Rfl:    atorvastatin (LIPITOR) 40 mg tablet, TAKE 1 TABLET(40 MG) BY MOUTH DAILY, Disp: 90 tablet, Rfl: 1   azithromycin (ZITHROMAX) 250 mg tablet, Take 1 tablet (250 mg total) by mouth 3 (three) times a week., Disp: 12 tablet, Rfl: 11   calcium  carbonate-vitamin D3 (OYSTER SHELL) 250 mg-3.125 mcg (125 unit) tab per tablet, Take 1 tablet by mouth daily., Disp: , Rfl:    fluticasone  propionate (FLONASE ) 50 mcg/spray nasal  spray, Administer 1 spray into each nostril 2 (two) times a day as needed for rhinitis., Disp: , Rfl:    furosemide  (LASIX ) 20 mg tablet, Take 1 tablet (20 mg total) by mouth daily., Disp: 90 tablet, Rfl: 1   gabapentin (NEURONTIN) 300 mg capsule, Take 1 capsule (300 mg total) by mouth 3 (three) times a day. (Patient taking differently: Take 300 mg by mouth 3 (three) times a day. Pt is taking 200 mg TID), Disp: 270 capsule, Rfl: 1   guaiFENesin (MUCINEX) 600 mg 12 hr tablet, Take 1,200 mg by mouth 2 (two) times a day as needed for cough., Disp: , Rfl:    ipratropium-albuteroL  (DUO-NEB) 0.5-2.5 mg/3 mL nebulizer solution, Take 3 mL by nebulization every 6 (six) hours., Disp: 360 mL, Rfl: 11   lisinopriL (PRINIVIL) 40 mg tablet, Take 1 tablet (40 mg total) by mouth daily., Disp: 90 tablet, Rfl: 1   mirtazapine (REMERON) 7.5 mg tablet, Take 1 tablet (7.5 mg total) by mouth at bedtime., Disp: 90 tablet, Rfl: 0   multivitamin with folic acid 400 mcg tab, Take 1 tablet by mouth Once Daily., Disp: , Rfl:    polyethylene glycol (MIRALAX ) 17 gram powd powder, Take 17 g by mouth daily as needed for constipation., Disp: , Rfl:    white petrolatum-mineral oiL (Nighttime Dry-Eye Relief) 57.3-42.5 % oint, Administer 1 Application into each eyes nightly as needed., Disp: , Rfl:  [3] Patient Active Problem List Diagnosis   Abnormal loss of weight   Depressive disorder   Essential hypertension   Fall in elderly patient   Fatigue   Fracture of distal end of left radius   Generalized osteoarthrosis, involving multiple sites   Mixed hyperlipidemia   Insomnia   Osteoporosis   Trigeminal neuralgia   Vitamin D deficiency   Bigeminy   Chest discomfort   Abnormal EKG   Abnormal nuclear stress test   Bradycardia   Sensorineural hearing loss (SNHL) of both ears   Tinnitus of both ears   Abnormal CT of the chest   Pulmonary nodule, right   Bronchiectasis without complication  (HCC)   SVT (supraventricular tachycardia)   Palpitations   Non-seasonal allergic rhinitis   Healthcare maintenance   Hyponatremia   Multifocal pneumonia   CHF exacerbation (HCC)   Chest pain  syndrome   Chronic systolic congestive heart failure (HCC)   Stage 3a chronic kidney disease (HCC)   Influenza B   Sensation of fullness in both ears   Prediabetes   Dizziness   Vertigo  [4] Past Surgical History: Procedure Laterality Date   NO PAST SURGERIES     Procedure: NO PAST SURGERIES  [5] Family History Problem Relation Name Age of Onset   Diabetes Brother     Stroke Brother     Diabetes Maternal Grandmother

## 2023-07-23 NOTE — Progress Notes (Signed)
 Meds Ordered in [facility]  Medication Sig Dispense Refill    Pulmonary Follow up Note - [facility]  [address], [place] [address] [phone] F [phone] [address], [place] [address] [phone]     Pulmonary Medicine Outpatient Return Visit    Assessment/Plan:   1. Bronchiectasis without complication (HCC)  azithromycin (ZITHROMAX) 250 mg tablet    2. Multifocal pneumonia  azithromycin (ZITHROMAX) 250 mg tablet        Plan was discussed with Dr. Nathanael as my supervising physician  Prentice BROCKS. Dallis RIGGERS [facility] 07/23/2023 10:31 AM  I have personally spent 40 minutes involved in face-to-face and non-face-to-face activities for this patient on the day of the visit.  Professional time spent includes the following activities, in addition to those noted in the documentation: Chart review, care coordination and discussion of treatment/management plan   Interval History:    History of Present Illness:   Heidi Spence has past medical history of bronchiectasis, recurrent pneumonia, allergic rhinitis, hypertension, bigeminy, supraventricular tachycardia, chronic systolic congestive heart failure, pulmonary hypertension, sensorineural loss of hearing both ears, trigeminal neuralgia, osteoporosis, chronic kidney disease, insomnia, hyperlipidemia, depressive disorder and chest pain syndrome.  She has medication allergy to ciprofloxacin. She is a non-smoker and has never smoked.  Heidi Spence presents today for hospital follow-up.  She was seen and treated at [facility] 06/04/2023 - 06/06/2023.  She was admitted for 3 weeks of dizziness, heart failure and fatigue related to hyponatremia.  She was treated for pneumonia with amoxicillin  and has been doing better.  She has not been refilling the azithromycin.  I have asked that she use the azithromycin Monday Wednesday and Friday and  refill the prescription every month.  Hospital Course: [facility] 06/04/2023 - 06/06/2023 For full details, please see H&P, progress notes, consult notes and ancillary notes.    Briefly, 83YO F w/ PMH of HFrEF (40-45%), SVT, trigeminal neuralgia,, HLD, CKD3a, bronchiectasis, p/w 3-week of non-positional dizziness associated w/ fatigue, BL ear pain, and HA.   The patient's hospital course will be summarized in a problem based approach below.   #C/f Meniere's Disease #Headache, tinnitis #Recent progressive BL hearing loss  #Fatigue #Hyponatremia #HFrEF (40-45%) #Hx of SVT Patient p/w 1-week of fatigue, dizziness, BL ear pain, and headache. She was also diagnosed with recent BL hearing loss and has been following audiology. She also endorses pulsatile tinnitus with sensation fo fullness in the ears. Seen in the ED 1 month ago for chest pain w/ a negative cardiac w/u.    Her Na was 125 on admission which normalized with fluids. Infectious w/u here unremarkable.  CTH unremarkable. MRI showed NAICA, w/ chronic lacunar infarct in posterior limb of L internal capsule, minor white matter chronic small vessel disease, and mild generalized atrophy. He takes lasix  for HFpEF.    Neurology was consulted who recommended CTA angiogram to r/o vertebrobasilar etiology which was WNL. Ultimate c/f meniere's disease given hearing loss (presumed sensorineural), vertigo, and tinnitus. Referral to ENT outpatient and vestibular neurorehab.     On day of discharge, patient is clinically stable with no new examination findings or acute symptoms compared to prior. The patient was seen by the attending physician on the date of discharge and deemed stable and acceptable for discharge. The patient's chronic medical conditions were treated accordingly per the patient's home medication regimen. The patient's medication reconciliation, follow-up appointments, discharge orders, instructions and significant lab  and diagnostic studies  are as noted.       Previous Report Reviewed: ER records, lab reports, and office notes   The following portions of the patient's history were reviewed and updated as appropriate: allergies, current medications, past family history, past medical history, past social history, past surgical history, and problem list.   Tobacco status:  reports that she has never smoked. She has never used smokeless tobacco.  Review of Systems A comprehensive review of systems was negative.   Past History:   Active Ambulatory Problems    Diagnosis Date Noted   Abnormal loss of weight 06/22/2012   Depressive disorder 07/19/2015   Essential hypertension 02/18/2013   Fall in elderly patient 06/23/2015   Fatigue 07/19/2015   Fracture of distal end of left radius 11/20/2013   Generalized osteoarthrosis, involving multiple sites 06/22/2012   Mixed hyperlipidemia 02/18/2013   Insomnia 02/18/2013   Osteoporosis 02/18/2013   Trigeminal neuralgia 02/18/2013   Vitamin D deficiency 05/12/2014   Bigeminy 01/05/2017   Chest discomfort 01/17/2017   Abnormal EKG 01/26/2017   Abnormal nuclear stress test 01/26/2017   Bradycardia 02/13/2017   Sensorineural hearing loss (SNHL) of both ears 02/26/2017   Tinnitus of both ears 02/26/2017   Abnormal CT of the chest 09/18/2017   Pulmonary nodule, right 09/18/2017   Bronchiectasis without complication (HCC) 10/05/2017   SVT (supraventricular tachycardia) 03/05/2018   Palpitations 03/05/2018   Non-seasonal allergic rhinitis 01/21/2020   Healthcare maintenance 04/23/2020   Hyponatremia 10/20/2020   Multifocal pneumonia 10/25/2020   CHF exacerbation (HCC) 10/25/2020   Chest pain syndrome 02/08/2021   Chronic systolic congestive heart failure (HCC) 03/10/2021   Stage 3a chronic kidney disease (HCC) 11/18/2021   Influenza B 03/08/2022   Sensation of fullness in both ears 09/26/2022   Prediabetes 02/23/2023   Dizziness  06/05/2023   Vertigo 06/06/2023   Resolved Ambulatory Problems    Diagnosis Date Noted   No Resolved Ambulatory Problems   Past Medical History:  Diagnosis Date   Acute on chronic congestive heart failure    (CMD) 10/25/2020   Bronchiectasis    (CMD)    Hyperlipidemia    Hypertension    Family History[1] Social History Social History[2] Social History   Social History Narrative   Not on file     Medications:   Current Rx ordered in [facility][3]    Physical Exam:   Vitals:   07/23/23 1008  BP: 112/62  BP Location: Left arm  Patient Position: Sitting  Pulse: 61  Resp: 16  Temp: 97.2 F (36.2 C)  TempSrc: Temporal  SpO2: 100%  Weight: 35.7 kg (78 lb 12.8 oz)  Height: 1.473 m (4' 10)   General appearance: alert, appears stated age, and cooperative BMI 16.49, accompanied by daughter Dorthea who translates Lao HEEN: Normocephalic, without obvious abnormality, atraumatic PERRLA EOMI No ocular lesions Mallampatti 2 Throat: lips, mucosa, and tongue normal; teeth and gums normal  Neck: no adenopathy, no carotid bruit, no JVD, supple, symmetrical, trachea midline, and thyroid not enlarged, symmetric, no tenderness/mass/nodules Lungs: Auscultation: clear to auscultation bilaterally. Palpation of chest wall reveals normal fremitus of the thorax. Percussion: No cough noted Heart: regular rate and rhythm, S1, S2 normal, no murmur, click, rub or gallop Peripheral lymphatic : no edema, no cyanosis no clubbing Vascular Pulses: 2+ and symmetric, no claudication Skin: Skin color, texture, turgor normal. No rashes or lesions Neurologic: Grossly normal with intact sensory , muscular exam.     Risks, benefits, and alternatives  of the medication(s) and treatment plan(s) were discussed, and he expressed understanding. Plan follow up as discussed or as needed. No barriers to treatment identified in this visit.    Thank you for allowing me to participate in the care of your  patient. Please do not hesitate to contact my office any time if we can be of further assistance          [1] Family History Problem Relation Name Age of Onset   Diabetes Brother     Stroke Brother     Diabetes Maternal Grandmother    [2] Social History Tobacco Use   Smoking status: Never   Smokeless tobacco: Never  Vaping Use   Vaping status: Never Used  Substance Use Topics   Alcohol use: No   Drug use: Never  [3] Meds Ordered in [facility]  Medication Sig Dispense Refill   alendronate (FOSAMAX) 70 mg tablet Take 1 tablet (70 mg total) by mouth once a week. In morning w/ full glass of water on an empty stomach,don't lie down x30 Min 12 tablet 1   aspirin  81 mg EC tablet Take 81 mg by mouth daily.     atorvastatin (LIPITOR) 40 mg tablet TAKE 1 TABLET(40 MG) BY MOUTH DAILY 90 tablet 1   calcium  carbonate-vitamin D3 (OYSTER SHELL) 250 mg-3.125 mcg (125 unit) tab per tablet Take 1 tablet by mouth daily.     fluticasone  propionate (FLONASE ) 50 mcg/spray nasal spray Administer 1 spray into each nostril as needed in the morning and 1 spray as needed in the evening for rhinitis.     furosemide  (LASIX ) 20 mg tablet Take 1 tablet (20 mg total) by mouth daily. 90 tablet 1   gabapentin (NEURONTIN) 300 mg capsule Take 1 capsule (300 mg total) by mouth 3 (three) times a day. (Patient taking differently: Take 300 mg by mouth 3 (three) times a day. Pt is taking 200 mg TID) 270 capsule 1   guaiFENesin (MUCINEX) 600 mg 12 hr tablet Take 1,200 mg by mouth as needed in the morning and 1,200 mg as needed in the evening for cough.     ipratropium-albuteroL  (DUO-NEB) 0.5-2.5 mg/3 mL nebulizer solution Take 3 mL by nebulization every 6 (six) hours. 360 mL 11   lisinopriL (PRINIVIL) 40 mg tablet Take 1 tablet (40 mg total) by mouth daily. 90 tablet 1   mirtazapine (REMERON) 7.5 mg tablet Take 1 tablet (7.5 mg total) by mouth at bedtime. 90 tablet 0   multivitamin with folic acid  400 mcg tab Take 1 tablet by mouth daily.     polyethylene glycol (MIRALAX ) 17 gram powd powder Take 17 g by mouth daily as needed for constipation.     white petrolatum-mineral oiL (Nighttime Dry-Eye Relief) 57.3-42.5 % oint Apply 1 Application to both eyes nightly as needed.     azithromycin (ZITHROMAX) 250 mg tablet Take 1 tablet (250 mg total) by mouth 3 (three) times a week. 12 tablet 11   No current Epic-ordered facility-administered medications on file.

## 2023-12-07 NOTE — Telephone Encounter (Signed)
 Duoneb was sent in today. Patients daughter is aware. Electronically signed by: Vina SHAUNNA Heap, CMA 12/07/2023 9:54 AM

## 2024-01-11 ENCOUNTER — Emergency Department (HOSPITAL_COMMUNITY)

## 2024-01-11 ENCOUNTER — Observation Stay (HOSPITAL_COMMUNITY)

## 2024-01-11 ENCOUNTER — Inpatient Hospital Stay (HOSPITAL_COMMUNITY)
Admission: EM | Admit: 2024-01-11 | Discharge: 2024-01-15 | DRG: 194 | Disposition: A | Discharge status: Home / Self Care | Attending: Internal Medicine | Admitting: Internal Medicine

## 2024-01-11 DIAGNOSIS — M810 Age-related osteoporosis without current pathological fracture: Secondary | ICD-10-CM | POA: Diagnosis present

## 2024-01-11 DIAGNOSIS — Z79899 Other long term (current) drug therapy: Secondary | ICD-10-CM

## 2024-01-11 DIAGNOSIS — Z8419 Family history of other disorders of kidney and ureter: Secondary | ICD-10-CM

## 2024-01-11 DIAGNOSIS — Z681 Body mass index (BMI) 19 or less, adult: Secondary | ICD-10-CM

## 2024-01-11 DIAGNOSIS — I10 Essential (primary) hypertension: Secondary | ICD-10-CM

## 2024-01-11 DIAGNOSIS — E876 Hypokalemia: Secondary | ICD-10-CM | POA: Diagnosis present

## 2024-01-11 DIAGNOSIS — R7303 Prediabetes: Secondary | ICD-10-CM | POA: Diagnosis present

## 2024-01-11 DIAGNOSIS — I5020 Unspecified systolic (congestive) heart failure: Secondary | ICD-10-CM

## 2024-01-11 DIAGNOSIS — G4700 Insomnia, unspecified: Secondary | ICD-10-CM | POA: Diagnosis present

## 2024-01-11 DIAGNOSIS — J159 Unspecified bacterial pneumonia: Secondary | ICD-10-CM | POA: Diagnosis present

## 2024-01-11 DIAGNOSIS — W1830XA Fall on same level, unspecified, initial encounter: Secondary | ICD-10-CM | POA: Diagnosis present

## 2024-01-11 DIAGNOSIS — Z7983 Long term (current) use of bisphosphonates: Secondary | ICD-10-CM

## 2024-01-11 DIAGNOSIS — Y9301 Activity, walking, marching and hiking: Secondary | ICD-10-CM | POA: Diagnosis present

## 2024-01-11 DIAGNOSIS — I130 Hypertensive heart and chronic kidney disease with heart failure and stage 1 through stage 4 chronic kidney disease, or unspecified chronic kidney disease: Secondary | ICD-10-CM | POA: Diagnosis present

## 2024-01-11 DIAGNOSIS — R54 Age-related physical debility: Secondary | ICD-10-CM | POA: Diagnosis present

## 2024-01-11 DIAGNOSIS — E875 Hyperkalemia: Secondary | ICD-10-CM | POA: Diagnosis present

## 2024-01-11 DIAGNOSIS — R636 Underweight: Secondary | ICD-10-CM | POA: Diagnosis present

## 2024-01-11 DIAGNOSIS — B370 Candidal stomatitis: Secondary | ICD-10-CM | POA: Diagnosis present

## 2024-01-11 DIAGNOSIS — Z7982 Long term (current) use of aspirin: Secondary | ICD-10-CM

## 2024-01-11 DIAGNOSIS — S22080A Wedge compression fracture of T11-T12 vertebra, initial encounter for closed fracture: Principal | ICD-10-CM

## 2024-01-11 DIAGNOSIS — I4891 Unspecified atrial fibrillation: Secondary | ICD-10-CM

## 2024-01-11 DIAGNOSIS — I5022 Chronic systolic (congestive) heart failure: Secondary | ICD-10-CM | POA: Diagnosis present

## 2024-01-11 DIAGNOSIS — J101 Influenza due to other identified influenza virus with other respiratory manifestations: Secondary | ICD-10-CM

## 2024-01-11 DIAGNOSIS — K5900 Constipation, unspecified: Secondary | ICD-10-CM | POA: Diagnosis present

## 2024-01-11 DIAGNOSIS — Z881 Allergy status to other antibiotic agents status: Secondary | ICD-10-CM

## 2024-01-11 DIAGNOSIS — M4854XA Collapsed vertebra, not elsewhere classified, thoracic region, initial encounter for fracture: Secondary | ICD-10-CM | POA: Diagnosis present

## 2024-01-11 DIAGNOSIS — E785 Hyperlipidemia, unspecified: Secondary | ICD-10-CM | POA: Diagnosis present

## 2024-01-11 DIAGNOSIS — J1008 Influenza due to other identified influenza virus with other specified pneumonia: Principal | ICD-10-CM | POA: Diagnosis present

## 2024-01-11 DIAGNOSIS — H9193 Unspecified hearing loss, bilateral: Secondary | ICD-10-CM | POA: Diagnosis present

## 2024-01-11 DIAGNOSIS — W19XXXA Unspecified fall, initial encounter: Secondary | ICD-10-CM | POA: Diagnosis present

## 2024-01-11 DIAGNOSIS — E871 Hypo-osmolality and hyponatremia: Secondary | ICD-10-CM | POA: Diagnosis present

## 2024-01-11 DIAGNOSIS — F32A Depression, unspecified: Secondary | ICD-10-CM | POA: Diagnosis present

## 2024-01-11 DIAGNOSIS — N1831 Chronic kidney disease, stage 3a: Secondary | ICD-10-CM | POA: Diagnosis present

## 2024-01-11 DIAGNOSIS — I480 Paroxysmal atrial fibrillation: Secondary | ICD-10-CM | POA: Diagnosis present

## 2024-01-11 DIAGNOSIS — J129 Viral pneumonia, unspecified: Secondary | ICD-10-CM | POA: Diagnosis present

## 2024-01-11 LAB — CK: Total CK: 92 U/L (ref 38–234)

## 2024-01-11 LAB — CBC WITH DIFFERENTIAL/PLATELET
Abs Immature Granulocytes: 0.06 K/uL (ref 0.00–0.07)
Basophils Absolute: 0 K/uL (ref 0.0–0.1)
Basophils Relative: 0 %
Eosinophils Absolute: 0 K/uL (ref 0.0–0.5)
Eosinophils Relative: 0 %
HCT: 37 % (ref 36.0–46.0)
Hemoglobin: 12.8 g/dL (ref 12.0–15.0)
Immature Granulocytes: 1 %
Lymphocytes Relative: 8 %
Lymphs Abs: 0.6 K/uL — ABNORMAL LOW (ref 0.7–4.0)
MCH: 29.5 pg (ref 26.0–34.0)
MCHC: 34.6 g/dL (ref 30.0–36.0)
MCV: 85.3 fL (ref 80.0–100.0)
Monocytes Absolute: 0.5 K/uL (ref 0.1–1.0)
Monocytes Relative: 6 %
Neutro Abs: 6.9 K/uL (ref 1.7–7.7)
Neutrophils Relative %: 85 %
Platelets: 183 K/uL (ref 150–400)
RBC: 4.34 MIL/uL (ref 3.87–5.11)
RDW: 13.4 % (ref 11.5–15.5)
WBC: 8.1 K/uL (ref 4.0–10.5)
nRBC: 0 % (ref 0.0–0.2)

## 2024-01-11 LAB — URINALYSIS, ROUTINE W REFLEX MICROSCOPIC
Bacteria, UA: NONE SEEN
Bilirubin Urine: NEGATIVE
Glucose, UA: NEGATIVE mg/dL
Ketones, ur: 5 mg/dL — AB
Leukocytes,Ua: NEGATIVE
Nitrite: NEGATIVE
Protein, ur: 100 mg/dL — AB
Specific Gravity, Urine: 1.02 (ref 1.005–1.030)
pH: 7 (ref 5.0–8.0)

## 2024-01-11 LAB — I-STAT CHEM 8, ED
BUN: 12 mg/dL (ref 8–23)
Calcium, Ion: 1.07 mmol/L — ABNORMAL LOW (ref 1.15–1.40)
Chloride: 101 mmol/L (ref 98–111)
Creatinine, Ser: 0.8 mg/dL (ref 0.44–1.00)
Glucose, Bld: 166 mg/dL — ABNORMAL HIGH (ref 70–99)
HCT: 39 % (ref 36.0–46.0)
Hemoglobin: 13.3 g/dL (ref 12.0–15.0)
Potassium: 3.7 mmol/L (ref 3.5–5.1)
Sodium: 135 mmol/L (ref 135–145)
TCO2: 23 mmol/L (ref 22–32)

## 2024-01-11 LAB — COMPREHENSIVE METABOLIC PANEL WITH GFR
ALT: 44 U/L (ref 0–44)
AST: 101 U/L — ABNORMAL HIGH (ref 15–41)
Albumin: 3.8 g/dL (ref 3.5–5.0)
Alkaline Phosphatase: 78 U/L (ref 38–126)
Anion gap: 12 (ref 5–15)
BUN: 12 mg/dL (ref 8–23)
CO2: 22 mmol/L (ref 22–32)
Calcium: 8.8 mg/dL — ABNORMAL LOW (ref 8.9–10.3)
Chloride: 99 mmol/L (ref 98–111)
Creatinine, Ser: 0.78 mg/dL (ref 0.44–1.00)
GFR, Estimated: >60 mL/min
Glucose, Bld: 165 mg/dL — ABNORMAL HIGH (ref 70–99)
Potassium: 3.7 mmol/L (ref 3.5–5.1)
Sodium: 133 mmol/L — ABNORMAL LOW (ref 135–145)
Total Bilirubin: 0.4 mg/dL (ref 0.0–1.2)
Total Protein: 7.3 g/dL (ref 6.5–8.1)

## 2024-01-11 LAB — RESP PANEL BY RT-PCR (RSV, FLU A&B, COVID)  RVPGX2
Influenza A by PCR: POSITIVE — AB
Influenza B by PCR: NEGATIVE
Resp Syncytial Virus by PCR: NEGATIVE
SARS Coronavirus 2 by RT PCR: NEGATIVE

## 2024-01-11 LAB — MAGNESIUM: Magnesium: 1.8 mg/dL (ref 1.7–2.4)

## 2024-01-11 LAB — TROPONIN T, HIGH SENSITIVITY
Troponin T High Sensitivity: 25 ng/L — ABNORMAL HIGH (ref 0–19)
Troponin T High Sensitivity: 26 ng/L — ABNORMAL HIGH (ref 0–19)

## 2024-01-11 LAB — D-DIMER, QUANTITATIVE: D-Dimer, Quant: 3.23 ug{FEU}/mL — ABNORMAL HIGH (ref 0.00–0.50)

## 2024-01-11 MED ORDER — FUROSEMIDE 20 MG PO TABS
20.0000 mg | ORAL_TABLET | Freq: Every day | ORAL | Status: DC
  Administered 2024-01-11: 20 mg via ORAL
  Filled 2024-01-11 (×2): qty 1

## 2024-01-11 MED ORDER — SODIUM CHLORIDE 0.9 % IV BOLUS: 500.0000 mL | Freq: Once | INTRAVENOUS | Status: DC

## 2024-01-11 MED ORDER — SODIUM CHLORIDE 0.9% FLUSH
3.0000 mL | Freq: Two times a day (BID) | INTRAVENOUS | Status: DC
  Administered 2024-01-11 – 2024-01-15 (×8): 3 mL via INTRAVENOUS

## 2024-01-11 MED ORDER — IBUPROFEN 400 MG PO TABS
600.0000 mg | ORAL_TABLET | Freq: Once | ORAL | Status: AC
  Administered 2024-01-11: 600 mg via ORAL
  Filled 2024-01-11: qty 1

## 2024-01-11 MED ORDER — OSELTAMIVIR PHOSPHATE 75 MG PO CAPS
75.0000 mg | ORAL_CAPSULE | Freq: Two times a day (BID) | ORAL | Status: DC
  Administered 2024-01-11 – 2024-01-12 (×3): 75 mg via ORAL
  Filled 2024-01-11 (×4): qty 1

## 2024-01-11 MED ORDER — SODIUM CHLORIDE 0.9 % IV SOLN
1.0000 g | Freq: Two times a day (BID) | INTRAVENOUS | Status: DC
  Administered 2024-01-11: 1 g via INTRAVENOUS
  Filled 2024-01-11: qty 10

## 2024-01-11 MED ORDER — METRONIDAZOLE 500 MG/100ML IV SOLN
500.0000 mg | Freq: Two times a day (BID) | INTRAVENOUS | Status: DC
  Administered 2024-01-11 – 2024-01-13 (×4): 500 mg via INTRAVENOUS
  Filled 2024-01-11 (×4): qty 100

## 2024-01-11 MED ORDER — IOHEXOL 350 MG/ML SOLN
75.0000 mL | Freq: Once | INTRAVENOUS | Status: AC | PRN
  Administered 2024-01-11: 75 mL via INTRAVENOUS

## 2024-01-11 MED ORDER — SODIUM CHLORIDE 0.9 % IV BOLUS
500.0000 mL | Freq: Once | INTRAVENOUS | Status: AC
  Administered 2024-01-11: 500 mL via INTRAVENOUS

## 2024-01-11 MED ORDER — ACETAMINOPHEN 650 MG RE SUPP: 650.0000 mg | Freq: Four times a day (QID) | RECTAL | Status: DC | PRN

## 2024-01-11 MED ORDER — ALBUTEROL SULFATE (2.5 MG/3ML) 0.083% IN NEBU
2.5000 mg | INHALATION_SOLUTION | RESPIRATORY_TRACT | Status: DC | PRN
  Administered 2024-01-14: 2.5 mg via RESPIRATORY_TRACT
  Filled 2024-01-11: qty 3

## 2024-01-11 MED ORDER — IOHEXOL 350 MG/ML SOLN
60.0000 mL | Freq: Once | INTRAVENOUS | Status: AC | PRN
  Administered 2024-01-11: 60 mL via INTRAVENOUS

## 2024-01-11 MED ORDER — ACETAMINOPHEN 325 MG PO TABS
650.0000 mg | ORAL_TABLET | Freq: Four times a day (QID) | ORAL | Status: DC | PRN
  Administered 2024-01-11 – 2024-01-13 (×4): 650 mg via ORAL
  Filled 2024-01-11 (×4): qty 2

## 2024-01-11 MED ORDER — HEPARIN SODIUM (PORCINE) 5000 UNIT/ML IJ SOLN
5000.0000 [IU] | Freq: Two times a day (BID) | INTRAMUSCULAR | Status: DC
  Administered 2024-01-11 – 2024-01-13 (×4): 5000 [IU] via SUBCUTANEOUS
  Filled 2024-01-11 (×4): qty 1

## 2024-01-11 MED ORDER — MORPHINE SULFATE (PF) 2 MG/ML IV SOLN: 2.0000 mg | INTRAVENOUS | Status: DC | PRN

## 2024-01-11 NOTE — Progress Notes (Signed)
 Orthopedic Tech Progress Note Patient Details:  Heidi Spence 07-08-1940 [id number] At bedside w/ family. Ortho Devices Type of Ortho Device: Thoracolumbar corset (TLSO) Ortho Device/Splint Location: T SPINE Ortho Device/Splint Interventions: Ordered   Post Interventions Patient Tolerated: Other (comment) Instructions Provided: Care of device  [name] 01/11/2024, 8:56 PM

## 2024-01-11 NOTE — ED Triage Notes (Signed)
 Pt bibems from home. Fell on right side, walking out of the bedroom. No head injury, no LOC, no thinners. Complains of right hip and lower back. No shortening or rotation. Hx of fever since Sunday. Temp 100.7, given Tylenol  650 mg at 1015.  BP 158/86 HR 78 95% RA  CBG 128

## 2024-01-11 NOTE — ED Provider Notes (Signed)
 "  EMERGENCY DEPARTMENT AT [facility] Provider Note   CSN: [id number] Arrival date & time: 01/11/24  1054     Patient presents with: Heidi Spence is a 83 y.o. female with a history of CHF, COPD, and diabetes who presents to the ED for evaluation after a fall that happened this morning. Patient's family states that she fell onto carpeted floor in her bedroom and is now complaining of right hip pain.  Patient's family denies LOC, denies that she hit her head, no anticoagulation use.  The patient's family also states that she has been running a fever for the past couple of days and has not been feeling well. The patients family reports associated fatigue and decreased appetite.  The patient's family also reports productive cough, without chest pain or shortness of breath.  The patient is in no acute distress.    Fall       Prior to Admission medications  Medication Sig Start Date End Date Taking? Authorizing Provider  albuterol  (VENTOLIN  HFA) 108 (90 Base) MCG/ACT inhaler Inhale 2 puffs into the lungs every 6 (six) hours as needed for wheezing or shortness of breath.    [provider]  alendronate (FOSAMAX) 70 MG tablet Take 70 mg by mouth once a week. 05/29/16   [provider]  amoxicillin -clavulanate (AUGMENTIN ) 875-125 MG tablet Take 1 tablet by mouth every 12 (twelve) hours. 05/07/23   Jerrol Agent, MD  aspirin  EC 81 MG tablet Take 1 tablet (81 mg total) by mouth daily. Swallow whole. 07/04/22   Perri DELENA Meliton Mickey., MD  atorvastatin (LIPITOR) 40 MG tablet Take 40 mg by mouth daily. 03/06/22 09/21/22  [provider]  azithromycin (ZITHROMAX) 250 MG tablet Take 250 mg by mouth 3 (three) times a week.    [provider]  Cranberry 200 MG CAPS Take 200 mg by mouth daily.    [provider]  dextromethorphan-guaiFENesin (MUCINEX DM) 30-600 MG 12hr tablet Take 1 tablet by mouth daily as needed for cough.    [provider]  furosemide  (LASIX ) 20 MG tablet Take 20 mg by mouth daily. 03/23/22   [provider]  HYDROcodone -acetaminophen  (NORCO) 5-325 MG per tablet Take 1-2 tablets by mouth every 6 (six) hours as needed. 11/09/13   Geroldine Berg, MD  ipratropium-albuterol  (DUONEB) 0.5-2.5 (3) MG/3ML SOLN Take 3 mLs by nebulization every 6 (six) hours as needed (shortness of breath). 07/03/22 09/01/22  Perri DELENA Meliton Mickey., MD  lisinopril (ZESTRIL) 20 MG tablet Take 20 mg by mouth daily.    [provider]  loratadine (CLARITIN) 10 MG tablet Take 10 mg by mouth daily as needed for allergies or rhinitis. 08/16/21   [provider]  Multiple Vitamin (MULTIVITAMIN WITH MINERALS) TABS tablet Take 1 tablet by mouth daily.    [provider]  NON FORMULARY Take 1 tablet by mouth daily. [employer] - RingStop tablet.    [provider]  polyethylene glycol powder (GLYCOLAX /MIRALAX ) 17 GM/SCOOP powder Take 17 g by mouth daily. Take one capful daily 05/07/23   Jerrol Agent, MD  traZODone (DESYREL) 50 MG tablet Take 50 mg by mouth at bedtime. 05/29/16   [provider]  Teresa Gardner Oil (SOOTHE NIGHTTIME) OINT Place 1 Application into both eyes at bedtime.    [provider]    Allergies: Ciprofloxacin    Review of Systems  Respiratory:  Positive for cough.     Updated Vital Signs BP 101/60 (BP  Location: Right Arm)   Pulse 64   Temp 98.3 F (36.8 C) (Oral)   Resp (!) 24   SpO2 97%   Physical Exam Vitals and nursing note reviewed.  Constitutional:      General: She is awake. She is not in acute distress.    Appearance: Normal appearance. She is not toxic-appearing.  HENT:     Head: Normocephalic and atraumatic.     Nose: Congestion and rhinorrhea present. Rhinorrhea is clear.     Mouth/Throat:     Mouth: Mucous membranes are moist.  Eyes:     General: Lids are normal. Vision grossly intact.     Extraocular Movements:  Extraocular movements intact.     Conjunctiva/sclera: Conjunctivae normal.     Pupils: Pupils are equal, round, and reactive to light.  Cardiovascular:     Rate and Rhythm: Normal rate and regular rhythm.     Pulses:          Radial pulses are 2+ on the right side.  Pulmonary:     Effort: Pulmonary effort is normal.     Breath sounds: Normal breath sounds. No wheezing or rhonchi.     Comments: Patient is in no respiratory distress at the time of examination.  No wheezing or rhonchi noted on auscultation. Abdominal:     General: Abdomen is flat.     Palpations: Abdomen is soft.     Tenderness: There is generalized abdominal tenderness.  Musculoskeletal:     Cervical back: Full passive range of motion without pain and neck supple. Tenderness present. No rigidity. No spinous process tenderness.     Thoracic back: Tenderness present. No deformity.     Lumbar back: Tenderness present. No deformity.     Right hip: Tenderness present. No deformity. Normal range of motion.  Lymphadenopathy:     Cervical: Cervical adenopathy present.  Skin:    General: Skin is warm and dry.     Capillary Refill: Capillary refill takes less than 2 seconds.     Findings: No rash.  Neurological:     General: No focal deficit present.     Mental Status: She is alert.  Psychiatric:        Attention and Perception: Attention normal.        Mood and Affect: Mood normal.        Speech: Speech normal.     (all labs ordered are listed, but only abnormal results are displayed) Labs Reviewed  RESP PANEL BY RT-PCR (RSV, FLU A&B, COVID)  RVPGX2 - Abnormal; Notable for the following components:      Result Value   Influenza A by PCR POSITIVE (*)    All other components within normal limits  CBC WITH DIFFERENTIAL/PLATELET - Abnormal; Notable for the following components:   Lymphs Abs 0.6 (*)    All other components within normal limits  COMPREHENSIVE METABOLIC PANEL WITH GFR - Abnormal; Notable for the following  components:   Sodium 133 (*)    Glucose, Bld 165 (*)    Calcium  8.8 (*)    AST 101 (*)    All other components within normal limits  URINALYSIS, ROUTINE W REFLEX MICROSCOPIC - Abnormal; Notable for the following components:   Hgb urine dipstick MODERATE (*)    Ketones, ur 5 (*)    Protein, ur 100 (*)    All other components within normal limits  I-STAT CHEM 8, ED - Abnormal; Notable for the following components:   Glucose, Bld 166 (*)  Calcium , Ion 1.07 (*)    All other components within normal limits  CK  D-DIMER, QUANTITATIVE  MAGNESIUM   I-STAT CG4 LACTIC ACID, ED  TROPONIN T, HIGH SENSITIVITY    EKG: None  Radiology: CT ABDOMEN PELVIS W CONTRAST Result Date: 01/11/2024 EXAM: CT ABDOMEN AND PELVIS WITH CONTRAST 01/11/2024 01:42:59 PM TECHNIQUE: CT of the abdomen and pelvis was performed with the administration of 60 mL of iohexol  (OMNIPAQUE ) 350 MG/ML injection. Multiplanar reformatted images are provided for review. Automated exposure control, iterative reconstruction, and/or weight-based adjustment of the mA/kV was utilized to reduce the radiation dose to as low as reasonably achievable. COMPARISON: None available. CLINICAL HISTORY: Abdominal pain, acute, nonlocalized. FINDINGS: LOWER CHEST: Diffuse bronchial wall thickening in the lung bases with mucous plugging and centrilobular and tree in bud nodularity scattered throughout the visualized lungs. Mild cardiomegaly with multivessel coronary atherosclerosis. LIVER: The liver is unremarkable. GALLBLADDER AND BILE DUCTS: Gallbladder is unremarkable. No biliary ductal dilatation. SPLEEN: No acute abnormality. PANCREAS: No acute abnormality. ADRENAL GLANDS: No acute abnormality. KIDNEYS, URETERS AND BLADDER: Multifocal cortical scarring in the kidneys bilaterally. No stones in the kidneys or ureters. No hydronephrosis. No perinephric or periureteral stranding. Urinary bladder is unremarkable. GI AND BOWEL: Total colonic diverticulosis.  Subtle wall thickening and inflammatory stranding along the proximal ascending colon. Stomach demonstrates no acute abnormality. There is no bowel obstruction. PERITONEUM AND RETROPERITONEUM: No ascites. No free air. No free pelvic fluid. VASCULATURE: Diffuse aortoiliac atherosclerosis. Aorta is normal in caliber. LYMPH NODES: No lymphadenopathy. REPRODUCTIVE ORGANS: Age related atrophy of the uterus and ovaries. BONES AND SOFT TISSUES: Osteopenia. Mild compression fracture of T12. No retropulsion. No focal soft tissue abnormality. IMPRESSION: 1. Mild compression fracture of T12, favored to be acute. No retropulsion. 2. Subtle wall thickening and inflammatory stranding along the proximal ascending colon, which may reflect changes of acute diverticulitis versus an infectious or inflammatory colitis. 3. Similar findings in the lung bases, likely reflecting changes of chronic atypical infection, such as MAI. Electronically signed by: Rogelia Myers MD 01/11/2024 02:24 PM EST RP Workstation: [id number]   DG Femur Portable 1 View Right Result Date: 01/11/2024 CLINICAL DATA:  Fall. EXAM: RIGHT FEMUR PORTABLE 1 VIEW COMPARISON:  None Available. FINDINGS: One-view portable exam of the right femur shows no evidence for an acute fracture. Mild degenerative changes are noted at the knee. No worrisome lytic or sclerotic osseous abnormality. IMPRESSION: 1. No acute bony findings on this single one-view portable exam. 2. Mild degenerative changes at the knee. Electronically Signed   By: Camellia Candle M.D.   On: 01/11/2024 12:23   DG Chest Portable 1 View Result Date: 01/11/2024 EXAM: 1 VIEW(S) XRAY OF THE CHEST 01/11/2024 11:48:00 AM COMPARISON: 05/06/2023 CLINICAL HISTORY: 83 year old female with cough. FINDINGS: LUNGS AND PLEURA: Hyperinflation of lungs. Chronic coarsened interstitial markings without pulmonary edema. Similar-appearing upper lobe nodules. Chronic calcified granuloma in the superior segment of the left  lower lobe. Coarse bilateral pulmonary interstitial opacities appears chronic and stable. No pleural effusion. No pneumothorax. HEART AND MEDIASTINUM: Aortic calcification. No acute abnormality of the cardiac and mediastinal silhouettes. BONES AND SOFT TISSUES: No acute osseous abnormality. IMPRESSION: 1. COPD.  No acute cardiopulmonary abnormality. Electronically signed by: Helayne Hurst MD 01/11/2024 12:22 PM EST RP Workstation: [id number]   DG Pelvis Portable Result Date: 01/11/2024 CLINICAL DATA:  Fall. EXAM: PORTABLE PELVIS 1-2 VIEWS COMPARISON:  None Available. FINDINGS: Bones are demineralized. No evidence for superior or inferior pubic ramus fracture. No femoral head dislocation. No evidence for  femoral neck fracture on this one view study with limited assessment of the right femoral neck due to positioning. IMPRESSION: 1. No acute bony findings. 2. Limited assessment of the right femoral neck due to positioning. Electronically Signed   By: Camellia Candle M.D.   On: 01/11/2024 12:21     Procedures   Medications Ordered in the ED  oseltamivir  (TAMIFLU ) capsule 75 mg (has no administration in time range)  furosemide  (LASIX ) tablet 20 mg (has no administration in time range)  heparin  injection 5,000 Units (has no administration in time range)  sodium chloride  flush (NS) 0.9 % injection 3 mL (has no administration in time range)  acetaminophen  (TYLENOL ) tablet 650 mg (has no administration in time range)    Or  acetaminophen  (TYLENOL ) suppository 650 mg (has no administration in time range)  morphine  (PF) 2 MG/ML injection 2 mg (has no administration in time range)  albuterol  (PROVENTIL ) (2.5 MG/3ML) 0.083% nebulizer solution 2.5 mg (has no administration in time range)  ibuprofen  (ADVIL ) tablet 600 mg (600 mg Oral Given 01/11/24 1347)  sodium chloride  0.9 % bolus 500 mL (0 mLs Intravenous Stopped 01/11/24 1557)  iohexol  (OMNIPAQUE ) 350 MG/ML injection 60 mL (60 mLs Intravenous Contrast Given  01/11/24 1342)                                 Medical Decision Making Amount and/or Complexity of Data Reviewed Labs: ordered. Decision-making details documented in ED Course. Radiology: ordered. Decision-making details documented in ED Course. ECG/medicine tests:  Decision-making details documented in ED Course.  Risk Prescription drug management. Decision regarding hospitalization.   Patient presents to the ED for: flu-like symptoms, fall This involves an extensive number of treatment options Differential diagnosis includes: Traumatic versus minor MSK etiology Infectious etiology Co-morbid conditions: CHF, COPD, hypertension  Additional history/records obtained and reviewed: Additional history obtained from  outside medical records  External records from outside source obtained and reviewed including multiple family medicine records for medical history and treatment regimen.  Clinical Course as of 01/11/24 1625  Fri Jan 11, 2024  1120 Temp: 99.8 F (37.7 C) Febrile, vital stable, patient in no acute distress.  Patient speaks unknown language-working with interpreter to try and figure out what language she speaks-is able to relay that things hurt when palpated her right hip and across her abdomen. [ML]  1135 Family in room [ML]  1205 CBC with Differential(!) WNL [ML]  1206 ED EKG Sinus  [ML]  1230 Given fluid bolus and ibuprofen  for symptomatic relief - well tolerated [ML]  1242 DG Chest Portable 1 View Findings consistent with COPD [ML]  1419 Resp panel by RT-PCR (RSV, Flu A&B, Covid) Anterior Nasal Swab(!) Flu A + [ML]  1420 Comprehensive metabolic panel(!) No acute findings  [ML]  1444 CT ABDOMEN PELVIS W CONTRAST Compression fracture T12 -  [ML]  1448 -Stable -Fall getting out of bed -T12 compression fx, Flu A + -admit [SE]  1503 Urinalysis, Routine w reflex microscopic -Urine, Clean CatchMAGNUS Cords [ML]    Clinical Course User Index [ML] Willma Duwaine CROME,  PA [SE] Guillermina Hamilton, MD    Data Reviewed / Actions Taken: Labs ordered/reviewed with my independent interpretation in ED course above. Imaging ordered/reviewed with my independent interpretation in ED course above. I agree with the radiologists interpretation.  EKG ordered/reviewed with my independent interpretation in ED course above.   Management / Treatments: See ED course above for  medications, treatments administered, and clinical rationale.   Reevaluation of the patient after these medicines showed that the patient had mild improvement in pain. I have reviewed the patients home medicines and have made adjustments as needed  ED Course / Reassessments: Problem List: Fall, flu A 82 year old female presented for evaluation after fall. Initial assessment included history, physical exam, and review of prior medical records. Laboratory studies, imaging, and other ancillary studies were obtained and key results included patient positive for flu A and a T12 fracture visualized on CT. Management included fluid bolus and analgesics, with ongoing reassessment. Vital signs were obtained and monitored, and the patient remained stable throughout the stay.  Given patient's physical exam findings, increasing weakness with a Flu A diagnosis leading to a fall with sustained T12 fracture, patient to be admitted for further evaluation and supportive care. Serial reassessments performed: Yes    Consultations:  Hospitalist - Tobie MD Consult recommendations incorporated into plan: To admit for further evaluation of T12 fracture and supportive care for ongoing flu A symptoms.  Disposition: Disposition: Admission Rationale for disposition: Patient requires further evaluation and care The disposition plan and rationale were discussed with the patient at the bedside, all questions were addressed, and the patient demonstrated understanding.  This note was produced using Electronics Engineer.  While I have reviewed and verified all clinical information, transcription errors may remain.      Final diagnoses:  Compression fracture of T12 vertebra, initial encounter [facility])  Influenza A    ED Discharge Orders     None          Willma Duwaine CROME, [place] 01/11/24 1638    Neysa Caron PARAS, DO 01/11/24 1648  "

## 2024-01-11 NOTE — ED Notes (Signed)
 Floor notified patient coming up

## 2024-01-11 NOTE — ED Notes (Signed)
 Report attempted at this time. Writer awaiting nurse call back.

## 2024-01-11 NOTE — H&P (Signed)
 " History and Physical    PatientIlleana Spence FMW:[id number] DOB: [birth year] DOA: 01/11/2024 DOS: the patient was seen and examined on 01/11/2024 . PCP: Godwin Shed, MD  Patient coming from: Home Chief complaint: Chief Complaint  Patient presents with   Fall   HPI:  Heidi Spence is a 83 y.o. female with past medical history  of  h/o HTN, Hyperlipidemia, Chronic Systolic CHF, SVT, Bradycardia, CKD Stage 3a, Pulmonary Nodule, Bronchiectasis, Chest Discomfort, Trigeminal Neuralgia, Osteoporosis, Hyponatremia, Vitamin D Deficiency, Allergic Rhinitis, Bilat. Hearing Loss, Bilat. Tinnitus, Insomnia, Depression brought from home for --fall on right side while walking out of bedroom. No LOC or head injury or blood thinners. Pt fell on right hip and low back.report of fever since Sunday and initial temp of 100.7. BP 158/86. HR of 78,O2 sats of 95%, CBG 128. Daughter at bedside is translating and speaking with patient and they speak Laotian.    Called daughter Heidi Spence to give update pt last night felt feverish and early in am at 8 fell and heard a thump and found her on the floor. Pt did not loose consciousness.  Pt sees cardiology dr Pietro and was told echo was ok.   ED Course:  Vital signs in the ED were notable for the following:  Vitals:   01/11/24 1600 01/11/24 1730 01/11/24 1824 01/11/24 1947  BP: (!) 153/70 (!) 140/69 (!) 150/70 (!) 145/71  Pulse: 62 60 60 60  Temp:   97.8 F (36.6 C) 98.2 F (36.8 C)  Resp: 20 (!) 27 20 (!) 22  SpO2: 98% 99% 96% 93%  TempSrc:   Oral Oral   >>ED evaluation thus far shows: -CT abdomen and pelvis done today shows mild compression fracture of T12 that is favored to be acute with no retropulsion, subtle wall thickening and inflammatory stranding along the proximal ascending colon which may be acute versus infectious colitis.  Chronic changes in lung bases consistent with atypical infection such as MAI.  Patient does have diffuse bronchial wall thickening in  lung bases with mucous plugging and centrilobular and tree-in-bud nodularity scattered throughout the lung bases. -Initial CMP shows sodium 133 glucose 165 calcium  8.8 AST of 101. -CBC is within normal limits. -Respiratory panel is positive for influenza A. -Urinalysis shows moderate hemoglobin 100+ protein 21-50 RBCs. - EKG shows sinus rhythm at 71 PR 171 QTc prolonged at 504 T wave inversions in inferolateral lateral leads and anterior leads.  On comparison to previous EKG from May 06, 2023 T wave inversions are present EKG looks somewhat improved however T wave inversions today are more pronounced.     >>While in the ED patient received the following: Medications  oseltamivir  (TAMIFLU ) capsule 75 mg (has no administration in time range)  ibuprofen  (ADVIL ) tablet 600 mg (600 mg Oral Given 01/11/24 1347)  sodium chloride  0.9 % bolus 500 mL (500 mLs Intravenous New Bag/Given 01/11/24 1351)  iohexol  (OMNIPAQUE ) 350 MG/ML injection 60 mL (60 mLs Intravenous Contrast Given 01/11/24 1342)   Review of Systems  Constitutional:  Positive for fever.  Respiratory:  Positive for shortness of breath.   Gastrointestinal:  Positive for abdominal pain and constipation.  Musculoskeletal:  Positive for falls.  Neurological:  Positive for weakness.   Past Medical History:  Diagnosis Date   Bronchiectasis (HCC)    CHF (congestive heart failure) (HCC)    COPD (chronic obstructive pulmonary disease) (HCC)    HLD (hyperlipidemia)    Hypertension    Prediabetes    Past Surgical  History:  Procedure Laterality Date   No prior surgery      reports that she has never smoked. She does not have any smokeless tobacco history on file. She reports that she does not drink alcohol and does not use drugs. Allergies[1] Family History  Problem Relation Age of Onset   Kidney failure Mother    Prior to Admission medications  Medication Sig Start Date End Date Taking? Authorizing Provider  albuterol  (VENTOLIN   HFA) 108 (90 Base) MCG/ACT inhaler Inhale 2 puffs into the lungs every 6 (six) hours as needed for wheezing or shortness of breath.    [provider]  alendronate (FOSAMAX) 70 MG tablet Take 70 mg by mouth once a week. 05/29/16   [provider]  amoxicillin -clavulanate (AUGMENTIN ) 875-125 MG tablet Take 1 tablet by mouth every 12 (twelve) hours. 05/07/23   Jerrol Agent, MD  aspirin  EC 81 MG tablet Take 1 tablet (81 mg total) by mouth daily. Swallow whole. 07/04/22   Perri DELENA Meliton Mickey., MD  atorvastatin (LIPITOR) 40 MG tablet Take 40 mg by mouth daily. 03/06/22 09/21/22  [provider]  azithromycin (ZITHROMAX) 250 MG tablet Take 250 mg by mouth 3 (three) times a week.    [provider]  Cranberry 200 MG CAPS Take 200 mg by mouth daily.    [provider]  dextromethorphan-guaiFENesin (MUCINEX DM) 30-600 MG 12hr tablet Take 1 tablet by mouth daily as needed for cough.    [provider]  furosemide  (LASIX ) 20 MG tablet Take 20 mg by mouth daily. 03/23/22   [provider]  HYDROcodone -acetaminophen  (NORCO) 5-325 MG per tablet Take 1-2 tablets by mouth every 6 (six) hours as needed. 11/09/13   Geroldine Berg, MD  ipratropium-albuterol  (DUONEB) 0.5-2.5 (3) MG/3ML SOLN Take 3 mLs by nebulization every 6 (six) hours as needed (shortness of breath). 07/03/22 09/01/22  Perri DELENA Meliton Mickey., MD  lisinopril (ZESTRIL) 20 MG tablet Take 20 mg by mouth daily.    [provider]  loratadine (CLARITIN) 10 MG tablet Take 10 mg by mouth daily as needed for allergies or rhinitis. 08/16/21   [provider]  Multiple Vitamin (MULTIVITAMIN WITH MINERALS) TABS tablet Take 1 tablet by mouth daily.    [provider]  NON FORMULARY Take 1 tablet by mouth daily. [employer] - RingStop tablet.    [provider]  polyethylene glycol powder (GLYCOLAX /MIRALAX ) 17 GM/SCOOP powder Take 17 g by mouth daily. Take one capful daily  05/07/23   Jerrol Agent, MD  traZODone (DESYREL) 50 MG tablet Take 50 mg by mouth at bedtime. 05/29/16   [provider]  Teresa Gardner Oil (SOOTHE NIGHTTIME) OINT Place 1 Application into both eyes at bedtime.    [provider]                                                                                 Vitals:   01/11/24 1600 01/11/24 1730 01/11/24 1824 01/11/24 1947  BP: (!) 153/70 (!) 140/69 (!) 150/70 (!) 145/71  Pulse: 62 60 60 60  Resp: 20 (!) 27 20 (!) 22  Temp:   97.8 F (36.6 C) 98.2 F (36.8 C)  TempSrc:   Oral Oral  SpO2: 98% 99% 96% 93%   Physical Exam Vitals reviewed.  Constitutional:      General: She is not in acute distress.    Appearance: She is not ill-appearing.  HENT:     Head: Normocephalic.  Eyes:     Extraocular Movements: Extraocular movements intact.  Cardiovascular:     Rate and Rhythm: Normal rate and regular rhythm.     Pulses: Normal pulses.     Heart sounds: Normal heart sounds.  Pulmonary:     Breath sounds: Normal breath sounds.     Comments: BL crackles in both base.   Abdominal:     General: There is no distension.     Palpations: Abdomen is soft.     Tenderness: There is no abdominal tenderness.  Musculoskeletal:     Right lower leg: No edema.     Left lower leg: No edema.  Neurological:     General: No focal deficit present.     Mental Status: She is alert.     Labs on Admission: I have personally reviewed following labs and imaging studies CBC: Recent Labs  Lab 01/11/24 1130 01/11/24 1139  WBC  --  8.1  NEUTROABS  --  6.9  HGB 13.3 12.8  HCT 39.0 37.0  MCV  --  85.3  PLT  --  183   Basic Metabolic Panel: Recent Labs  Lab 01/11/24 1130 01/11/24 1139 01/11/24 1835  NA 135 133*  --   K 3.7 3.7  --   CL 101 99  --   CO2  --  22  --   GLUCOSE 166* 165*  --   BUN 12 12  --   CREATININE 0.80 0.78  --   CALCIUM   --  8.8*  --   MG  --   --  1.8   GFR: CrCl cannot be calculated  (Unknown ideal weight.). Liver Function Tests: Recent Labs  Lab 01/11/24 1139  AST 101*  ALT 44  ALKPHOS 78  BILITOT 0.4  PROT 7.3  ALBUMIN 3.8   No results for input(s): LIPASE, AMYLASE in the last 168 hours. No results for input(s): AMMONIA in the last 168 hours. Recent Labs    05/06/23 2233 01/11/24 1130 01/11/24 1139  BUN 12 12 12   CREATININE 0.83 0.80 0.78   Cardiac Enzymes: Recent Labs  Lab 01/11/24 1835  CKTOTAL 92   BNP (last 3 results) No results for input(s): PROBNP in the last 8760 hours. HbA1C: No results for input(s): HGBA1C in the last 72 hours. CBG: No results for input(s): GLUCAP in the last 168 hours. Lipid Profile: No results for input(s): CHOL, HDL, LDLCALC, TRIG, CHOLHDL, LDLDIRECT in the last 72 hours. Thyroid Function Tests: No results for input(s): TSH, T4TOTAL, FREET4, T3FREE, THYROIDAB in the last 72 hours. Anemia Panel: No results for input(s): VITAMINB12, FOLATE, FERRITIN, TIBC, IRON, RETICCTPCT in the last 72 hours. Urine analysis:    Component Value Date/Time   COLORURINE YELLOW 01/11/2024 1111   APPEARANCEUR CLEAR 01/11/2024 1111   LABSPEC 1.020 01/11/2024 1111   PHURINE 7.0 01/11/2024 1111   GLUCOSEU NEGATIVE 01/11/2024 1111   HGBUR MODERATE (A) 01/11/2024 1111   BILIRUBINUR NEGATIVE 01/11/2024 1111   KETONESUR 5 (A) 01/11/2024 1111   PROTEINUR 100 (A) 01/11/2024 1111   NITRITE NEGATIVE 01/11/2024 1111   LEUKOCYTESUR NEGATIVE 01/11/2024 1111   Radiological Exams on Admission: CT ABDOMEN PELVIS W CONTRAST Result Date: 01/11/2024 EXAM: CT ABDOMEN AND PELVIS  WITH CONTRAST 01/11/2024 01:42:59 PM TECHNIQUE: CT of the abdomen and pelvis was performed with the administration of 60 mL of iohexol  (OMNIPAQUE ) 350 MG/ML injection. Multiplanar reformatted images are provided for review. Automated exposure control, iterative reconstruction, and/or weight-based adjustment of the mA/kV was utilized  to reduce the radiation dose to as low as reasonably achievable. COMPARISON: None available. CLINICAL HISTORY: Abdominal pain, acute, nonlocalized. FINDINGS: LOWER CHEST: Diffuse bronchial wall thickening in the lung bases with mucous plugging and centrilobular and tree in bud nodularity scattered throughout the visualized lungs. Mild cardiomegaly with multivessel coronary atherosclerosis. LIVER: The liver is unremarkable. GALLBLADDER AND BILE DUCTS: Gallbladder is unremarkable. No biliary ductal dilatation. SPLEEN: No acute abnormality. PANCREAS: No acute abnormality. ADRENAL GLANDS: No acute abnormality. KIDNEYS, URETERS AND BLADDER: Multifocal cortical scarring in the kidneys bilaterally. No stones in the kidneys or ureters. No hydronephrosis. No perinephric or periureteral stranding. Urinary bladder is unremarkable. GI AND BOWEL: Total colonic diverticulosis. Subtle wall thickening and inflammatory stranding along the proximal ascending colon. Stomach demonstrates no acute abnormality. There is no bowel obstruction. PERITONEUM AND RETROPERITONEUM: No ascites. No free air. No free pelvic fluid. VASCULATURE: Diffuse aortoiliac atherosclerosis. Aorta is normal in caliber. LYMPH NODES: No lymphadenopathy. REPRODUCTIVE ORGANS: Age related atrophy of the uterus and ovaries. BONES AND SOFT TISSUES: Osteopenia. Mild compression fracture of T12. No retropulsion. No focal soft tissue abnormality. IMPRESSION: 1. Mild compression fracture of T12, favored to be acute. No retropulsion. 2. Subtle wall thickening and inflammatory stranding along the proximal ascending colon, which may reflect changes of acute diverticulitis versus an infectious or inflammatory colitis. 3. Similar findings in the lung bases, likely reflecting changes of chronic atypical infection, such as MAI. Electronically signed by: Rogelia Myers MD 01/11/2024 02:24 PM EST RP Workstation: [id number]   DG Femur Portable 1 View Right Result Date:  01/11/2024 CLINICAL DATA:  Fall. EXAM: RIGHT FEMUR PORTABLE 1 VIEW COMPARISON:  None Available. FINDINGS: One-view portable exam of the right femur shows no evidence for an acute fracture. Mild degenerative changes are noted at the knee. No worrisome lytic or sclerotic osseous abnormality. IMPRESSION: 1. No acute bony findings on this single one-view portable exam. 2. Mild degenerative changes at the knee. Electronically Signed   By: Camellia Candle M.D.   On: 01/11/2024 12:23   DG Chest Portable 1 View Result Date: 01/11/2024 EXAM: 1 VIEW(S) XRAY OF THE CHEST 01/11/2024 11:48:00 AM COMPARISON: 05/06/2023 CLINICAL HISTORY: 83 year old female with cough. FINDINGS: LUNGS AND PLEURA: Hyperinflation of lungs. Chronic coarsened interstitial markings without pulmonary edema. Similar-appearing upper lobe nodules. Chronic calcified granuloma in the superior segment of the left lower lobe. Coarse bilateral pulmonary interstitial opacities appears chronic and stable. No pleural effusion. No pneumothorax. HEART AND MEDIASTINUM: Aortic calcification. No acute abnormality of the cardiac and mediastinal silhouettes. BONES AND SOFT TISSUES: No acute osseous abnormality. IMPRESSION: 1. COPD.  No acute cardiopulmonary abnormality. Electronically signed by: Helayne Hurst MD 01/11/2024 12:22 PM EST RP Workstation: [id number]   DG Pelvis Portable Result Date: 01/11/2024 CLINICAL DATA:  Fall. EXAM: PORTABLE PELVIS 1-2 VIEWS COMPARISON:  None Available. FINDINGS: Bones are demineralized. No evidence for superior or inferior pubic ramus fracture. No femoral head dislocation. No evidence for femoral neck fracture on this one view study with limited assessment of the right femoral neck due to positioning. IMPRESSION: 1. No acute bony findings. 2. Limited assessment of the right femoral neck due to positioning. Electronically Signed   By: Camellia Candle M.D.   On: 01/11/2024 12:21  Data Reviewed: Relevant notes from primary care and  specialist visits, past discharge summaries as available in EHR, including Care Everywhere . Prior diagnostic testing as pertinent to current admission diagnoses, Updated medications and problem lists for reconciliation .ED course, including vitals, labs, imaging, treatment and response to treatment,Triage notes, nursing and pharmacy notes and ED provider's notes.Notable results as noted in HPI.Discussed case with EDMD/ ED APP/ or Specialty MD on call and as needed.  Assessment & Plan   >>Fall: Attribute  to generalized weakness from her acute illness with influenza A, fall precaution, PT eval prior to discharge.  Patient's fall is unclear if this is mechanical related to dizziness or presyncope based on history. Low threshold for PE evaluation.   >> Elevated D-dimer: With patient's recent history of fall, is highly suggestive of possible VTE and will proceed with CT chest PE protocol and lower extremity venous Dopplers.   >> Abdominal pain /Abnormal CT scan: CT mentions concerns of diverticulitis versus colitis, patient does have symptoms of lower abdominal pain over the past few days and started pt on rocephin  and flagyl .   >>T12 compression fracture: Will admit for pain control, TLSO brace, early mobilization, outpatient neurosurgery consult.  Patient has a history of osteoporosis and is on alendronate.  Pain control. D/W daughter about fracture and plan.    >> Influenza A: Continue patient on Tamiflu  with droplet isolation.  I did no conservative measures and supportive care antiemetics antipyretics IV fluids antitussives as needed.  As needed albuterol .   >> Essential hypertension: Vitals:   01/11/24 1111 01/11/24 1145 01/11/24 1200 01/11/24 1553  BP: (!) 148/68 112/64 129/68 101/60   01/11/24 1600 01/11/24 1730 01/11/24 1824 01/11/24 1947  BP: (!) 153/70 (!) 140/69 (!) 150/70 (!) 145/71  Currently will hold patient's lisinopril 20 mg and Lasix .   >> Chronic systolic congestive  heart failure: Initially patient has received half liter normal saline and have d/c order for additional 500cc. Will monitor and follow with strict I's and O's.  Daily weights. Additional IV fluids as deemed appropriate or needed. Mild TNI elevation and we will repeat.Per daughter last echo in 06/2022: 1. Left ventricular ejection fraction, by estimation, is 60 to 65%. The  left ventricle has normal function. The left ventricle has no regional  wall motion abnormalities. Left ventricular diastolic parameters are  consistent with Grade I diastolic dysfunction (impaired relaxation).   2. Right ventricular systolic function is normal. The right ventricular  size is normal. There is mildly elevated pulmonary artery systolic  pressure.   3. Left atrial size was moderately dilated.   4. Right atrial size was mildly dilated.   5. The mitral valve is abnormal. Mild mitral valve regurgitation. No  evidence of mitral stenosis.   6. Calcified non coronary cusp. The aortic valve is tricuspid. There is  mild calcification of the aortic valve. Aortic valve regurgitation is not  visualized. Aortic valve sclerosis is present, with no evidence of aortic  valve stenosis.   7. The inferior vena cava is normal in size with greater than 50%  respiratory variability, suggesting right atrial pressure of 3 mmHg.     >> CKD stage IIIa: Lab Results  Component Value Date   CREATININE 0.78 01/11/2024   CREATININE 0.80 01/11/2024   CREATININE 0.83 05/06/2023  Stable and will follow.   >> Mild AST elevation: AST of 100 could be from patient's acute illness, statin therapy, dehydration.  As mentioned above we have given patient a total 1  L since arrival to ED and will follow will hold patient's statin therapy. Hold patient's blood pressure medication to avoid hypoperfusion and hypotension that may be also contributing to her mild AST elevation.   DVT prophylaxis:  SCDs.  Consults:  None.  Advance Care  Planning:    Code Status: Full Code   Family Communication:  Suki: [phone].  Disposition Plan:  Home.  Severity of Illness: The appropriate patient status for this patient is INPATIENT. Inpatient status is judged to be reasonable and necessary in order to provide the required intensity of service to ensure the patient's safety. The patient's presenting symptoms, physical exam findings, and initial radiographic and laboratory data in the context of their chronic comorbidities is felt to place them at high risk for further clinical deterioration. Furthermore, it is not anticipated that the patient will be medically stable for discharge from the hospital within 2 midnights of admission.   * I certify that at the point of admission it is my clinical judgment that the patient will require inpatient hospital care spanning beyond 2 midnights from the point of admission due to high intensity of service, high risk for further deterioration and high frequency of surveillance required.*  Unresulted Labs (From admission, onward)     Start     Ordered   01/12/24 0500  Comprehensive metabolic panel  Tomorrow morning,   R        01/11/24 1600   01/12/24 0500  CBC  Tomorrow morning,   R        01/11/24 1600   01/12/24 0500  Magnesium  Tomorrow morning,   R       Question:  Specimen collection method  Answer:  Lab=Lab collect   01/11/24 2005   01/12/24 0500  Phosphorus  Tomorrow morning,   R       Question:  Specimen collection method  Answer:  Lab=Lab collect   01/11/24 2005            Meds ordered this encounter  Medications   ibuprofen  (ADVIL ) tablet 600 mg   sodium chloride  0.9 % bolus 500 mL   iohexol  (OMNIPAQUE ) 350 MG/ML injection 60 mL   oseltamivir  (TAMIFLU ) capsule 75 mg   DISCONTD: sodium chloride  0.9 % bolus 500 mL   furosemide  (LASIX ) tablet 20 mg   heparin  injection 5,000 Units   sodium chloride  flush (NS) 0.9 % injection 3 mL   OR Linked Order Group    acetaminophen   (TYLENOL ) tablet 650 mg    acetaminophen  (TYLENOL ) suppository 650 mg   morphine  (PF) 2 MG/ML injection 2 mg   albuterol  (PROVENTIL ) (2.5 MG/3ML) 0.083% nebulizer solution 2.5 mg   cefTRIAXone  (ROCEPHIN ) 1 g in sodium chloride  0.9 % 100 mL IVPB    Antibiotic Indication::   Intra-abdominal   metroNIDAZOLE  (FLAGYL ) IVPB 500 mg    Antibiotic Indication::   Intra-abdominal Infection     Orders Placed This Encounter  Procedures   Resp panel by RT-PCR (RSV, Flu A&B, Covid) Anterior Nasal Swab   DG Pelvis Portable   DG Femur Portable 1 View Right   CT ABDOMEN PELVIS W CONTRAST   DG Chest Portable 1 View   CBC with Differential   Comprehensive metabolic panel   Urinalysis, Routine w reflex microscopic -Urine, Clean Catch   CK   Comprehensive metabolic panel   CBC   D-dimer, quantitative   Magnesium   Magnesium   Phosphorus   Diet 2 gram sodium Room service appropriate? Yes with  Assist; Fluid consistency: Thin   Maintain IV access   Vital signs   Notify physician (specify)   Refer to Sidebar Report Mobility Protocol for Adult Inpatient   Initiate Adult Central Line Maintenance and Catheter Clearance Protocol for patients with central line (CVC, PICC, Port, Hemodialysis, Trialysis)   Daily weights   Intake and Output   Initiate CHG Protocol for patients in ICU/SD or any patient with a central line or foley catheter   Do not place and if present remove PureWick   Initiate Oral Care Protocol   Initiate Carrier Fluid Protocol   RN may order General Admission PRN Orders utilizing General Admission PRN medications (through manage orders) for the following patient needs: allergy symptoms (Claritin), cold sores (Carmex), cough (Robitussin DM), eye irritation (Liquifilm Tears), hemorrhoids (Tucks), indigestion (Maalox), minor skin irritation (Hydrocortisone Cream), muscle pain Lucienne Gay), nose irritation (saline nasal spray) and sore throat (Chloraseptic spray).   Cardiac Monitoring -  Continuous Indefinite   Ambulate with assistance   Apply TLSO brace   Maintain TLSO brace   Full code   Consult for Unassigned Medical Admission   PT eval and treat   Pulse oximetry check with vital signs   Oxygen therapy Mode or (Route): Nasal cannula; Liters Per Minute: 2; Keep O2 saturation between: greater than 92 %   I-stat chem 8, ED (not at [facility], [facility] or [facility])   I-Stat CG4 Lactic Acid   ED EKG   EKG 12-Lead   Place in observation (patient's expected length of stay will be less than 2 midnights)   Aspiration precautions   Fall precautions    Author: Mario LULLA Blanch, MD 12 pm- 8 pm. [facility]. 01/11/2024 8:20 PM Please note for any communication after hours contact [facility] Assigned provider on call on Amion.       [1]  Allergies Allergen Reactions   Ciprofloxacin Rash   "

## 2024-01-11 NOTE — ED Notes (Signed)
 EMS reported pt speaks Vietnamese, pt unable to understand language. Sounds as if pt says she speaks Mandarin, unable to understand translator.

## 2024-01-11 NOTE — Hospital Course (Signed)
 SABRA

## 2024-01-11 NOTE — Plan of Care (Signed)

## 2024-01-12 DIAGNOSIS — I130 Hypertensive heart and chronic kidney disease with heart failure and stage 1 through stage 4 chronic kidney disease, or unspecified chronic kidney disease: Secondary | ICD-10-CM | POA: Diagnosis present

## 2024-01-12 DIAGNOSIS — Y9301 Activity, walking, marching and hiking: Secondary | ICD-10-CM | POA: Diagnosis present

## 2024-01-12 DIAGNOSIS — R54 Age-related physical debility: Secondary | ICD-10-CM | POA: Diagnosis present

## 2024-01-12 DIAGNOSIS — E875 Hyperkalemia: Secondary | ICD-10-CM | POA: Diagnosis present

## 2024-01-12 DIAGNOSIS — J101 Influenza due to other identified influenza virus with other respiratory manifestations: Secondary | ICD-10-CM | POA: Diagnosis present

## 2024-01-12 DIAGNOSIS — R636 Underweight: Secondary | ICD-10-CM | POA: Diagnosis present

## 2024-01-12 DIAGNOSIS — S22080A Wedge compression fracture of T11-T12 vertebra, initial encounter for closed fracture: Secondary | ICD-10-CM | POA: Diagnosis not present

## 2024-01-12 DIAGNOSIS — I10 Essential (primary) hypertension: Secondary | ICD-10-CM | POA: Diagnosis not present

## 2024-01-12 DIAGNOSIS — N1831 Chronic kidney disease, stage 3a: Secondary | ICD-10-CM | POA: Diagnosis present

## 2024-01-12 DIAGNOSIS — H9193 Unspecified hearing loss, bilateral: Secondary | ICD-10-CM | POA: Diagnosis present

## 2024-01-12 DIAGNOSIS — M810 Age-related osteoporosis without current pathological fracture: Secondary | ICD-10-CM | POA: Diagnosis present

## 2024-01-12 DIAGNOSIS — I480 Paroxysmal atrial fibrillation: Secondary | ICD-10-CM | POA: Diagnosis present

## 2024-01-12 DIAGNOSIS — F32A Depression, unspecified: Secondary | ICD-10-CM | POA: Diagnosis present

## 2024-01-12 DIAGNOSIS — I5022 Chronic systolic (congestive) heart failure: Secondary | ICD-10-CM | POA: Diagnosis present

## 2024-01-12 DIAGNOSIS — J1008 Influenza due to other identified influenza virus with other specified pneumonia: Secondary | ICD-10-CM | POA: Diagnosis present

## 2024-01-12 DIAGNOSIS — E871 Hypo-osmolality and hyponatremia: Secondary | ICD-10-CM | POA: Diagnosis present

## 2024-01-12 DIAGNOSIS — M4854XA Collapsed vertebra, not elsewhere classified, thoracic region, initial encounter for fracture: Secondary | ICD-10-CM | POA: Diagnosis present

## 2024-01-12 DIAGNOSIS — Z7982 Long term (current) use of aspirin: Secondary | ICD-10-CM | POA: Diagnosis not present

## 2024-01-12 DIAGNOSIS — J129 Viral pneumonia, unspecified: Secondary | ICD-10-CM | POA: Diagnosis present

## 2024-01-12 DIAGNOSIS — J159 Unspecified bacterial pneumonia: Secondary | ICD-10-CM | POA: Diagnosis present

## 2024-01-12 DIAGNOSIS — I4891 Unspecified atrial fibrillation: Secondary | ICD-10-CM | POA: Diagnosis not present

## 2024-01-12 DIAGNOSIS — W1830XA Fall on same level, unspecified, initial encounter: Secondary | ICD-10-CM | POA: Diagnosis present

## 2024-01-12 DIAGNOSIS — B370 Candidal stomatitis: Secondary | ICD-10-CM | POA: Diagnosis present

## 2024-01-12 DIAGNOSIS — Z681 Body mass index (BMI) 19 or less, adult: Secondary | ICD-10-CM | POA: Diagnosis not present

## 2024-01-12 DIAGNOSIS — I5020 Unspecified systolic (congestive) heart failure: Secondary | ICD-10-CM | POA: Diagnosis not present

## 2024-01-12 DIAGNOSIS — W19XXXA Unspecified fall, initial encounter: Secondary | ICD-10-CM | POA: Diagnosis not present

## 2024-01-12 DIAGNOSIS — G4700 Insomnia, unspecified: Secondary | ICD-10-CM | POA: Diagnosis present

## 2024-01-12 DIAGNOSIS — E876 Hypokalemia: Secondary | ICD-10-CM | POA: Diagnosis present

## 2024-01-12 DIAGNOSIS — R7303 Prediabetes: Secondary | ICD-10-CM | POA: Diagnosis present

## 2024-01-12 DIAGNOSIS — E785 Hyperlipidemia, unspecified: Secondary | ICD-10-CM | POA: Diagnosis present

## 2024-01-12 DIAGNOSIS — K5900 Constipation, unspecified: Secondary | ICD-10-CM | POA: Diagnosis present

## 2024-01-12 LAB — COMPREHENSIVE METABOLIC PANEL WITH GFR
ALT: 32 U/L (ref 0–44)
AST: 67 U/L — ABNORMAL HIGH (ref 15–41)
Albumin: 3.3 g/dL — ABNORMAL LOW (ref 3.5–5.0)
Alkaline Phosphatase: 69 U/L (ref 38–126)
Anion gap: 11 (ref 5–15)
BUN: 14 mg/dL (ref 8–23)
CO2: 22 mmol/L (ref 22–32)
Calcium: 8.1 mg/dL — ABNORMAL LOW (ref 8.9–10.3)
Chloride: 99 mmol/L (ref 98–111)
Creatinine, Ser: 0.76 mg/dL (ref 0.44–1.00)
GFR, Estimated: >60 mL/min
Glucose, Bld: 91 mg/dL (ref 70–99)
Potassium: 3.3 mmol/L — ABNORMAL LOW (ref 3.5–5.1)
Sodium: 132 mmol/L — ABNORMAL LOW (ref 135–145)
Total Bilirubin: 0.3 mg/dL (ref 0.0–1.2)
Total Protein: 6.6 g/dL (ref 6.5–8.1)

## 2024-01-12 LAB — PHOSPHORUS: Phosphorus: 2.3 mg/dL — ABNORMAL LOW (ref 2.5–4.6)

## 2024-01-12 LAB — CBC
HCT: 33.8 % — ABNORMAL LOW (ref 36.0–46.0)
Hemoglobin: 11.8 g/dL — ABNORMAL LOW (ref 12.0–15.0)
MCH: 29.8 pg (ref 26.0–34.0)
MCHC: 34.9 g/dL (ref 30.0–36.0)
MCV: 85.4 fL (ref 80.0–100.0)
Platelets: 191 K/uL (ref 150–400)
RBC: 3.96 MIL/uL (ref 3.87–5.11)
RDW: 13.4 % (ref 11.5–15.5)
WBC: 6.5 K/uL (ref 4.0–10.5)
nRBC: 0 % (ref 0.0–0.2)

## 2024-01-12 LAB — MAGNESIUM: Magnesium: 1.9 mg/dL (ref 1.7–2.4)

## 2024-01-12 LAB — TROPONIN T, HIGH SENSITIVITY: Troponin T High Sensitivity: 27 ng/L — ABNORMAL HIGH (ref 0–19)

## 2024-01-12 MED ORDER — METOPROLOL TARTRATE 12.5 MG HALF TABLET: 12.5000 mg | ORAL_TABLET | Freq: Once | ORAL | Status: DC

## 2024-01-12 MED ORDER — IPRATROPIUM-ALBUTEROL 0.5-2.5 (3) MG/3ML IN SOLN
3.0000 mL | Freq: Four times a day (QID) | RESPIRATORY_TRACT | Status: DC | PRN
  Administered 2024-01-13 – 2024-01-15 (×4): 3 mL via RESPIRATORY_TRACT
  Filled 2024-01-12 (×3): qty 3

## 2024-01-12 MED ORDER — DILTIAZEM HCL 25 MG/5ML IV SOLN: 10.0000 mg | Freq: Once | INTRAVENOUS | Status: DC

## 2024-01-12 MED ORDER — ATORVASTATIN CALCIUM 40 MG PO TABS
40.0000 mg | ORAL_TABLET | Freq: Every day | ORAL | Status: DC
  Administered 2024-01-13 – 2024-01-15 (×3): 40 mg via ORAL
  Filled 2024-01-12 (×2): qty 1

## 2024-01-12 MED ORDER — HYDROCODONE-ACETAMINOPHEN 5-325 MG PO TABS
1.0000 | ORAL_TABLET | Freq: Four times a day (QID) | ORAL | Status: DC | PRN
  Administered 2024-01-12 – 2024-01-14 (×7): 1 via ORAL
  Filled 2024-01-12 (×7): qty 1

## 2024-01-12 MED ORDER — DILTIAZEM HCL 25 MG/5ML IV SOLN
5.0000 mg | Freq: Once | INTRAVENOUS | Status: AC
  Administered 2024-01-12: 5 mg via INTRAVENOUS
  Filled 2024-01-12: qty 5

## 2024-01-12 MED ORDER — POLYETHYLENE GLYCOL 3350 17 G PO PACK
17.0000 g | PACK | Freq: Every day | ORAL | Status: DC
  Administered 2024-01-12 – 2024-01-15 (×4): 17 g via ORAL
  Filled 2024-01-12 (×3): qty 1

## 2024-01-12 MED ORDER — SODIUM CHLORIDE 0.9 % IV SOLN: INTRAVENOUS | Status: DC

## 2024-01-12 MED ORDER — K PHOS MONO-SOD PHOS DI & MONO 155-852-130 MG PO TABS
500.0000 mg | ORAL_TABLET | Freq: Four times a day (QID) | ORAL | Status: AC
  Administered 2024-01-12 (×3): 500 mg via ORAL
  Filled 2024-01-12 (×3): qty 2

## 2024-01-12 MED ORDER — SODIUM CHLORIDE 0.9 % IV SOLN
2.0000 g | Freq: Every day | INTRAVENOUS | Status: DC
  Administered 2024-01-12 – 2024-01-13 (×2): 2 g via INTRAVENOUS
  Filled 2024-01-12 (×2): qty 20

## 2024-01-12 MED ORDER — MAGNESIUM HYDROXIDE 400 MG/5ML PO SUSP
30.0000 mL | Freq: Every day | ORAL | Status: DC | PRN
  Administered 2024-01-12: 30 mL via ORAL
  Filled 2024-01-12 (×2): qty 30

## 2024-01-12 MED ORDER — OSELTAMIVIR PHOSPHATE 30 MG PO CAPS
30.0000 mg | ORAL_CAPSULE | Freq: Two times a day (BID) | ORAL | Status: DC
  Administered 2024-01-13 – 2024-01-15 (×5): 30 mg via ORAL
  Filled 2024-01-12 (×4): qty 1

## 2024-01-12 NOTE — Evaluation (Signed)
 Physical Therapy Evaluation Patient Details Name: Heidi Spence MRN: [id number] DOB: October 20, 1940 Today's Date: 01/12/2024  History of Present Illness  Pt is an 83 y.o female presenting to [facility] on 12/26 following fall, reporting R hip and LBP.  X-ray shows T12 compression fx. Pt to wear TLSO seated and standing.  PMH: HTN, HLD, CHF, SVT, bradycardia, CKD, pulmonary nodule, bronchiectasis, trigeminal neuralgia, osteoporosis, hearing loss, insomnia, depression.   Clinical Impression  Pt is currently mobilizing below her baseline due to back pain and fatigue with activity. At baseline, pt is very independent with mobility and ADLs. Pt requries minA for bed mobility and transfers at this time using hand held assist. Pt declining use of DME. Log roll technique reviewed with patient to prevent bending or twisting during bed mobility. TLSO donned in chair with education provided. TLSO demonstrates good fit and is tolerable to patient. Pt would benefit from continued PT services focused on TLSO donning/doffing education, progressing tranfers and ambulation within patient tolerance.   Pt's daughter is staying with her through the new year is available for 24/7 support. Pt's son also lives with her but works full time. Discussed with family, pt ok to go home with 24/7 support, otherwise will benefit from brief stay at <3hr rehab to promote safety and independence with mobility.        If plan is discharge home, recommend the following: A little help with walking and/or transfers;A little help with bathing/dressing/bathroom;Assistance with cooking/housework;Assist for transportation;Help with stairs or ramp for entrance   Can travel by private vehicle   Yes    Equipment Recommendations [place] (Family has appropriate equipment at home, pt has refused to use thus far)  Recommendations for Other Services       Functional Status Assessment Patient has had a recent decline in their functional status and  demonstrates the ability to make significant improvements in function in a reasonable and predictable amount of time.     Precautions / Restrictions Precautions Precautions: Fall Recall of Precautions/Restrictions: Intact Required Braces or Orthoses: Spinal Brace Spinal Brace: Thoracolumbosacral orthotic;Applied in sitting position (For upright mobility, does not wear in bed) Restrictions Weight Bearing Restrictions Per Provider Order: No      Mobility  Bed Mobility Overal bed mobility: Needs Assistance Bed Mobility: Supine to Sit     Supine to sit: Min assist     General bed mobility comments: Trunk assist to minimize twisting, assist to pivot EOB.    Transfers Overall transfer level: Needs assistance Equipment used: 1 person hand held assist Transfers: Sit to/from Stand, Bed to chair/wheelchair/BSC Sit to Stand: Min assist   Step pivot transfers: Min assist       General transfer comment: Light hand held assist provided for balance during transfers. Pt steady upon standing, denies increased pain with mobility. Tolerating short bouts of activity at this time, declining further ambulation.    Ambulation/Gait                  Stairs            Wheelchair Mobility     Tilt Bed    Modified Rankin (Stroke Patients Only)       Balance Overall balance assessment: Mild deficits observed, not formally tested                                           Pertinent  Vitals/Pain Pain Assessment Pain Assessment: 0-10 Pain Score: 6  Pain Location: Back Pain Descriptors / Indicators: Aching, Sore, Sharp Pain Intervention(s): Limited activity within patient's tolerance, Repositioned, Monitored during session (TLSO donned)    Home Living Family/patient expects to be discharged to:: Private residence Living Arrangements: Children (Lives with son) Available Help at Discharge: Family;Available PRN/intermittently (Pt's son works full time.  Other family are local but also work. One daughter is currently visiting and available to be home 24/7 for short timeframe.) Type of Home: House Home Access: Stairs to enter Entrance Stairs-Rails: Right Entrance Stairs-Number of Steps: 3   Home Layout: One level Home Equipment: Rollator (4 wheels);BSC/3in1;Grab bars - tub/shower;Grab bars - toilet      Prior Function Prior Level of Function : Independent/Modified Independent             Mobility Comments: Pt has rollator available, daughter states she refuses to use assistive device to walk. ADLs Comments: Son assists with IADLs, pt performs all ADLs independently.     Extremity/Trunk Assessment   Upper Extremity Assessment Upper Extremity Assessment: Defer to OT evaluation    Lower Extremity Assessment Lower Extremity Assessment: Overall WFL for tasks assessed;Generalized weakness    Cervical / Trunk Assessment Cervical / Trunk Assessment: Kyphotic  Communication   Communication Communication: Impaired Factors Affecting Communication: Non - English speaking, interpreter not available (Daughter provides translation)    Cognition Arousal: Alert Behavior During Therapy: WFL for tasks assessed/performed   PT - Cognitive impairments: No apparent impairments                       PT - Cognition Comments: Daughter reports noticing some short term memory deficits recently but does well with extra cues. Following commands: Intact       Cueing Cueing Techniques: Verbal cues, Tactile cues, Visual cues     General Comments General comments (skin integrity, edema, etc.): VSS throughout. Back kyphotic and scoliotic. No significant skin abnormalities noted.    Exercises     Assessment/Plan    PT Assessment Patient needs continued PT services  PT Problem List Decreased strength;Decreased mobility;Decreased safety awareness;Decreased range of motion;Decreased coordination;Decreased knowledge of  precautions;Decreased activity tolerance;Decreased balance;Decreased knowledge of use of DME;Pain       PT Treatment Interventions DME instruction;Therapeutic exercise;Gait training;Balance training;Stair training;Neuromuscular re-education;Functional mobility training;Cognitive remediation;Therapeutic activities;Patient/family education;Other (comment) (TLSO donning/doffing)    PT Goals (Current goals can be found in the Care Plan section)  Acute Rehab PT Goals Patient Stated Goal: No additional goals stated Additional Goals Additional Goal #1: Pt will demonstrate proper donning/doffing techniques of TLSO with minA from family.    Frequency Min 2X/week     Co-evaluation               AM-PAC PT 6 Clicks Mobility  Outcome Measure Help needed turning from your back to your side while in a flat bed without using bedrails?: None Help needed moving from lying on your back to sitting on the side of a flat bed without using bedrails?: A Little Help needed moving to and from a bed to a chair (including a wheelchair)?: A Little Help needed standing up from a chair using your arms (e.g., wheelchair or bedside chair)?: A Little Help needed to walk in hospital room?: Total Help needed climbing 3-5 steps with a railing? : Total 6 Click Score: 15    End of Session Equipment Utilized During Treatment: Back brace Activity Tolerance: Patient limited by fatigue;Patient limited  by pain Patient left: in chair;with call bell/phone within reach;with chair alarm set;with family/visitor present Nurse Communication: Mobility status PT Visit Diagnosis: Unsteadiness on feet (R26.81);Muscle weakness (generalized) (M62.81);Pain    Time: 8949-8878 PT Time Calculation (min) (ACUTE ONLY): 31 min   Charges:   PT Evaluation $PT Eval Moderate Complexity: 1 Mod PT Treatments $Therapeutic Activity: 8-22 mins PT General Charges $$ ACUTE PT VISIT: 1 Visit         Sabra Morel, PT, DPT  Acute  Rehabilitation Services         Office: [phone]     Sabra MARLA Morel 01/12/2024, 5:05 PM

## 2024-01-12 NOTE — Progress Notes (Addendum)
 Called by tele-Patient in Afib. EKG Completed-confirmed Afib RVR. Went up to 140 and hanging between 110-120s. Asymptomatic. Vitals stable-MD Notified and charge RN

## 2024-01-12 NOTE — Progress Notes (Signed)
 " PROGRESS NOTE    Heidi Spence  FMW:[id number] DOB: 17-Mar-1940 DOA: 01/11/2024 PCP: Godwin Shed, MD   Chief Complaint  Patient presents with   Fall    Brief Narrative:  Heidi Spence is a 83 y.o. female with past medical history  of  h/o HTN, Hyperlipidemia, Chronic Systolic CHF, SVT, Bradycardia, CKD Stage 3a, Pulmonary Nodule, Bronchiectasis, Chest Discomfort, Trigeminal Neuralgia, Osteoporosis, Hyponatremia, Vitamin D Deficiency, Allergic Rhinitis, Bilat. Hearing Loss, Bilat. Tinnitus, Insomnia, Depression brought from home for --fall on right side while walking out of bedroom. No LOC or head injury or blood thinners.  Workup significant for influenza A and colitis.    Assessment & Plan:   Principal Problem:   Falls, initial encounter  Fall: T12 compression fracture Osteoporosis -Fall secondary to weakness, deconditioning in the setting of acute illness from influenza A infection, pneumonia and colitis-continue with PT/OT. - Pain is controlled -History of osteoporosis, continue with Fosamax. -PT/OT consulted. -TLSO brace while sitting up and with activity.  Influenza A infection Viral pneumonia versus bacterial pneumonia Acute colitis -Congestion, positive influenza A, imaging significant for plugging, questionable aspiration versus acute bronchiolitis. - Was encouraged with incentive spirometry and flutter valve with evidence of mucous plugging. - Continue with IV Rocephin  and Flagyl  to cover for both colitis and pneumonia - Continue with Tamiflu     Elevated D-dimers -Most likely acute elevated inflammatory markers in the setting of lonesome infection. - CTA negative for PE  Hyponatremia -Volume depletion, will start on IV fluids  Hypokalemia -Will replace  Hypophosphatemia -will start on supplements           DVT prophylaxis: ([place] heaptin) Code Status: (Full) Family Communication: (D/W daughter at bedside) Disposition: PT/OT pending, likely will  discharge home tomorrow in a.m. on p.o. antibiotic if oral intake improves, and her labs within normal limit.    Consultants:  None   Subjective:  Patient is feeling better today, appetite still unreliable, has cough  Objective: Vitals:   01/11/24 1947 01/11/24 2319 01/12/24 0450 01/12/24 0700  BP: (!) 145/71 (!) 148/70 (!) 142/62 (!) 147/69  Pulse: 60 60 (!) 59   Resp: (!) 22 (!) 21 19   Temp: 98.2 F (36.8 C) 97.9 F (36.6 C) 97.7 F (36.5 C) 97.9 F (36.6 C)  TempSrc: Oral Oral Oral Oral  SpO2: 93% 95% 95%     Intake/Output Summary (Last 24 hours) at 01/12/2024 1126 Last data filed at 01/11/2024 1557 Gross per 24 hour  Intake 500.76 ml  Output --  Net 500.76 ml   There were no vitals filed for this visit.  Examination:  Awake, alert, frail, no apparent distress She has scattered Rales at the lung bases Regular rate and rhythm Abdomen soft Lower extremities with no edema  Data Reviewed: I have personally reviewed following labs and imaging studies  CBC: Recent Labs  Lab 01/11/24 1130 01/11/24 1139 01/12/24 0359  WBC  --  8.1 6.5  NEUTROABS  --  6.9  --   HGB 13.3 12.8 11.8*  HCT 39.0 37.0 33.8*  MCV  --  85.3 85.4  PLT  --  183 191    Basic Metabolic Panel: Recent Labs  Lab 01/11/24 1130 01/11/24 1139 01/11/24 1835 01/12/24 0359  NA 135 133*  --  132*  K 3.7 3.7  --  3.3*  CL 101 99  --  99  CO2  --  22  --  22  GLUCOSE 166* 165*  --  91  BUN 12  12  --  14  CREATININE 0.80 0.78  --  0.76  CALCIUM   --  8.8*  --  8.1*  MG  --   --  1.8 1.9  PHOS  --   --   --  2.3*    GFR: CrCl cannot be calculated (Unknown ideal weight.).  Liver Function Tests: Recent Labs  Lab 01/11/24 1139 01/12/24 0359  AST 101* 67*  ALT 44 32  ALKPHOS 78 69  BILITOT 0.4 0.3  PROT 7.3 6.6  ALBUMIN 3.8 3.3*    CBG: No results for input(s): GLUCAP in the last 168 hours.   Recent Results (from the past 240 hours)  Resp panel by RT-PCR (RSV, Flu A&B,  Covid) Anterior Nasal Swab     Status: Abnormal   Collection Time: 01/11/24 11:26 AM   Specimen: Anterior Nasal Swab  Result Value Ref Range Status   SARS Coronavirus 2 by RT PCR NEGATIVE NEGATIVE Final   Influenza A by PCR POSITIVE (A) NEGATIVE Final   Influenza B by PCR NEGATIVE NEGATIVE Final    Comment: (NOTE) The Xpert Xpress SARS-CoV-2/FLU/RSV plus assay is intended as an aid in the diagnosis of influenza from Nasopharyngeal swab specimens and should not be used as a sole basis for treatment. Nasal washings and aspirates are unacceptable for Xpert Xpress SARS-CoV-2/FLU/RSV testing.  Fact Sheet for Patients: bloggercourse.com  Fact Sheet for Healthcare Providers: seriousbroker.it  This test is not yet approved or cleared by the [place]  FDA and has been authorized for detection and/or diagnosis of SARS-CoV-2 by FDA under an Emergency Use Authorization (EUA). This EUA will remain in effect (meaning this test can be used) for the duration of the COVID-19 declaration under Section 564(b)(1) of the Act, 21 U.S.C. section 360bbb-3(b)(1), unless the authorization is terminated or revoked.     Resp Syncytial Virus by PCR NEGATIVE NEGATIVE Final    Comment: (NOTE) Fact Sheet for Patients: bloggercourse.com  Fact Sheet for Healthcare Providers: seriousbroker.it  This test is not yet approved or cleared by the [place]  FDA and has been authorized for detection and/or diagnosis of SARS-CoV-2 by FDA under an Emergency Use Authorization (EUA). This EUA will remain in effect (meaning this test can be used) for the duration of the COVID-19 declaration under Section 564(b)(1) of the Act, 21 U.S.C. section 360bbb-3(b)(1), unless the authorization is terminated or revoked.  Performed at [facility], [address], [place], [place] [address]          Radiology  Studies: CT Angio Chest PE W and/or Wo Contrast Result Date: 01/11/2024 EXAM: CTA CHEST 01/11/2024 10:47:21 PM TECHNIQUE: CTA of the chest was performed after the administration of intravenous contrast. Multiplanar reformatted images are provided for review. MIP images are provided for review. Automated exposure control, iterative reconstruction, and/or weight based adjustment of the mA/kV was utilized to reduce the radiation dose to as low as reasonably achievable. COMPARISON: Portable chest earlier today, CTA chest 05/07/2023, chest CT without contrast 06/22/2022. CLINICAL HISTORY: Cough and congestion with a suspected pulmonary embolism. FINDINGS: PULMONARY ARTERIES: Pulmonary arteries are upper limits of normal in caliber with no evidence of thromboemboli. Mild dilatation in the superior pulmonary veins is again noted. MEDIASTINUM: There is mild to moderate cardiomegaly with a biatrial chamber predominance, particularly the left atrium. There is a prominent left atrial appendage. No pericardial effusion. There are scattered 3-vessel coronary calcifications. Mild to moderate patchy atherosclerosis of the aorta, minimal calcifications in the great vessels. There is  no aneurysm, stenosis, or dissection. There is a small hiatal hernia. Mild thickening of the thoracic esophagus consistent with esophagitis without masslike thickening. There are borderline prominent pretracheal and precarinal lymph nodes, but no enlarged thoracic lymph nodes. LYMPH NODES: Borderline prominent pretracheal and precarinal lymph nodes. No enlarged thoracic lymph nodes. No axillary lymphadenopathy. LUNGS AND PLEURA: There are mild features of centrilobular emphysema. Biapical and posterior upper lobe pleuroparenchymal scarring. There is a densely calcified granuloma in the superior segment of the left lower lobe. There is diffuse bronchial thickening especially in the right middle and lower lobes. There is fluid in the distal trachea,  both main bronchi and left lower lobe bronchus. There are segmental and subsegmental bronchial impactions in both lower lobes, on the left associated with infrahilar cylindrical bronchiectasis suggesting a chronic inflammatory process including chronic aspiration. Additional cylindrical bronchiectasis and scattered mucous plugging noted laterally in the right lower lobe, posteriorly in the right middle lobe. There is a trace left pleural effusion. Coarse linear scarlike markings in both bases chronically. Mild patchy alveolar infiltrates noted laterally in the superior segment of the left lower lobe without other focal pneumonic infiltrates. There is a new 7 mm irregular right upper lobe nodule posteriorly on series 6, axial 86. A 6 mm ground glass nodule laterally in the left upper lobe on axial 59. Possibly these may be inflammatory nodules, but follow-up imaging will be needed as well as to ensure clearing of the left lower lobe infiltrate. No pneumothorax. UPPER ABDOMEN: Limited images of the upper abdomen are unremarkable. SOFT TISSUES AND BONES: There is osteopenia, kyphosis, and degenerative change of the thoracic spine. There is a new, subacute but not chronic-appearing upper plate anterior wedge compression fracture of the T12 vertebral body, with anterior cortical buckling without retropulsion and a transverse fracture component extending from anterior to posterior within the vertebral body. Loss of vertebral height about 10 to 15 percent anteriorly and 5 to 10 percent posteriorly. There is no retropulsion. No other significant osseous findings. No acute soft tissue abnormality. IMPRESSION: 1. No evidence of pulmonary embolism. Borderline prominent pulmonary arteries. . 2. Left lower lobe mild patchy infiltrate with bronchial thickening, mucus plugging, and bronchiectasis, concerning for aspiration/infectious bronchiolitis. 3. New 7 mm irregular right upper lobe nodule and 6 mm left upper lobe ground-glass  nodule; recommend non-contrast chest CT at 3-6 months, then repeat non-contrast chest CT at 18-24 months, as per Fleischner Society Guidelines. 4. Mild to moderate cardiomegaly with biatrial chamber predominance. No pericardial effusion. 5. New subacute T12 vertebral body compression fracture. No retropulsion. 6. Aortic and coronary artery atherosclerosis. Electronically signed by: Francis Quam MD 01/11/2024 11:47 PM EST RP Workstation: [id number]   CT ABDOMEN PELVIS W CONTRAST Result Date: 01/11/2024 EXAM: CT ABDOMEN AND PELVIS WITH CONTRAST 01/11/2024 01:42:59 PM TECHNIQUE: CT of the abdomen and pelvis was performed with the administration of 60 mL of iohexol  (OMNIPAQUE ) 350 MG/ML injection. Multiplanar reformatted images are provided for review. Automated exposure control, iterative reconstruction, and/or weight-based adjustment of the mA/kV was utilized to reduce the radiation dose to as low as reasonably achievable. COMPARISON: None available. CLINICAL HISTORY: Abdominal pain, acute, nonlocalized. FINDINGS: LOWER CHEST: Diffuse bronchial wall thickening in the lung bases with mucous plugging and centrilobular and tree in bud nodularity scattered throughout the visualized lungs. Mild cardiomegaly with multivessel coronary atherosclerosis. LIVER: The liver is unremarkable. GALLBLADDER AND BILE DUCTS: Gallbladder is unremarkable. No biliary ductal dilatation. SPLEEN: No acute abnormality. PANCREAS: No acute abnormality. ADRENAL GLANDS: No acute  abnormality. KIDNEYS, URETERS AND BLADDER: Multifocal cortical scarring in the kidneys bilaterally. No stones in the kidneys or ureters. No hydronephrosis. No perinephric or periureteral stranding. Urinary bladder is unremarkable. GI AND BOWEL: Total colonic diverticulosis. Subtle wall thickening and inflammatory stranding along the proximal ascending colon. Stomach demonstrates no acute abnormality. There is no bowel obstruction. PERITONEUM AND RETROPERITONEUM: No  ascites. No free air. No free pelvic fluid. VASCULATURE: Diffuse aortoiliac atherosclerosis. Aorta is normal in caliber. LYMPH NODES: No lymphadenopathy. REPRODUCTIVE ORGANS: Age related atrophy of the uterus and ovaries. BONES AND SOFT TISSUES: Osteopenia. Mild compression fracture of T12. No retropulsion. No focal soft tissue abnormality. IMPRESSION: 1. Mild compression fracture of T12, favored to be acute. No retropulsion. 2. Subtle wall thickening and inflammatory stranding along the proximal ascending colon, which may reflect changes of acute diverticulitis versus an infectious or inflammatory colitis. 3. Similar findings in the lung bases, likely reflecting changes of chronic atypical infection, such as MAI. Electronically signed by: Rogelia Myers MD 01/11/2024 02:24 PM EST RP Workstation: [id number]   DG Femur Portable 1 View Right Result Date: 01/11/2024 CLINICAL DATA:  Fall. EXAM: RIGHT FEMUR PORTABLE 1 VIEW COMPARISON:  None Available. FINDINGS: One-view portable exam of the right femur shows no evidence for an acute fracture. Mild degenerative changes are noted at the knee. No worrisome lytic or sclerotic osseous abnormality. IMPRESSION: 1. No acute bony findings on this single one-view portable exam. 2. Mild degenerative changes at the knee. Electronically Signed   By: Camellia Candle M.D.   On: 01/11/2024 12:23   DG Chest Portable 1 View Result Date: 01/11/2024 EXAM: 1 VIEW(S) XRAY OF THE CHEST 01/11/2024 11:48:00 AM COMPARISON: 05/06/2023 CLINICAL HISTORY: 83 year old female with cough. FINDINGS: LUNGS AND PLEURA: Hyperinflation of lungs. Chronic coarsened interstitial markings without pulmonary edema. Similar-appearing upper lobe nodules. Chronic calcified granuloma in the superior segment of the left lower lobe. Coarse bilateral pulmonary interstitial opacities appears chronic and stable. No pleural effusion. No pneumothorax. HEART AND MEDIASTINUM: Aortic calcification. No acute abnormality of  the cardiac and mediastinal silhouettes. BONES AND SOFT TISSUES: No acute osseous abnormality. IMPRESSION: 1. COPD.  No acute cardiopulmonary abnormality. Electronically signed by: Helayne Hurst MD 01/11/2024 12:22 PM EST RP Workstation: [id number]   DG Pelvis Portable Result Date: 01/11/2024 CLINICAL DATA:  Fall. EXAM: PORTABLE PELVIS 1-2 VIEWS COMPARISON:  None Available. FINDINGS: Bones are demineralized. No evidence for superior or inferior pubic ramus fracture. No femoral head dislocation. No evidence for femoral neck fracture on this one view study with limited assessment of the right femoral neck due to positioning. IMPRESSION: 1. No acute bony findings. 2. Limited assessment of the right femoral neck due to positioning. Electronically Signed   By: Camellia Candle M.D.   On: 01/11/2024 12:21        Scheduled Meds:  heparin   5,000 Units Subcutaneous Q12H   oseltamivir   75 mg Oral BID   sodium chloride  flush  3 mL Intravenous Q12H   Continuous Infusions:  cefTRIAXone  (ROCEPHIN )  IV 2 g (01/12/24 0928)   metronidazole  500 mg (01/12/24 0431)     LOS: 0 days        Brayton Lye, MD [facility]   To contact the attending provider between 7A-7P or the covering provider during after hours 7P-7A, please log into the web site [email or web address] and access using universal [place] password for that web site. If you do not have the password, please call the hospital operator.  01/12/2024, 11:26 AM   "

## 2024-01-12 NOTE — Plan of Care (Signed)

## 2024-01-12 NOTE — Progress Notes (Signed)
"  ° °      CROSS COVER NOTE  NAME: Heidi Spence MRN: [id number] DOB : Jan 20, 1940    Concern as stated by nurse / staff   Patient went into Afib up to 140s now hanging low 100s. asymptomatic. BP 160/107  just got EKG and its afib currently 115      Pertinent findings on chart review: history of palpitations and SVT (cardiology note 03/2023)Toprol  was previously discontinued due to fatigue/bradycardia.   Patient Assessment     01/12/2024   10:43 PM 01/12/2024    8:00 PM 01/12/2024    4:22 PM  Vitals with BMI  Systolic 160 165 839  Diastolic 107 80 83  Pulse 113 70      Assessment and  Interventions   Assessment:  -New onset A-fib, with mild RVR -History of palpitations and SVT -History of beta-blocker intolerance (bradycardia and fatigue)  Plan: Diltiazem  bolus and monitor--> will start infusion if persistent A-fib Consider cardiology consult in the a.m.-last saw Dr. Pietro in March, 2025 X      01/13/2024    4:52 AM 01/13/2024    4:37 AM 01/13/2024    4:00 AM  Vitals with BMI  Weight  84 lbs 7 oz   Systolic   144  Diastolic   84  Pulse 88  84  Heart rate improved with dilt 5mg  bolus x 1      "

## 2024-01-13 ENCOUNTER — Other Ambulatory Visit (HOSPITAL_COMMUNITY): Payer: Self-pay

## 2024-01-13 DIAGNOSIS — W19XXXA Unspecified fall, initial encounter: Secondary | ICD-10-CM

## 2024-01-13 DIAGNOSIS — I4891 Unspecified atrial fibrillation: Secondary | ICD-10-CM

## 2024-01-13 DIAGNOSIS — I5020 Unspecified systolic (congestive) heart failure: Secondary | ICD-10-CM | POA: Diagnosis not present

## 2024-01-13 DIAGNOSIS — I480 Paroxysmal atrial fibrillation: Secondary | ICD-10-CM | POA: Diagnosis not present

## 2024-01-13 DIAGNOSIS — J101 Influenza due to other identified influenza virus with other respiratory manifestations: Secondary | ICD-10-CM

## 2024-01-13 LAB — CBC
HCT: 34.5 % — ABNORMAL LOW (ref 36.0–46.0)
Hemoglobin: 11.9 g/dL — ABNORMAL LOW (ref 12.0–15.0)
MCH: 29.2 pg (ref 26.0–34.0)
MCHC: 34.5 g/dL (ref 30.0–36.0)
MCV: 84.8 fL (ref 80.0–100.0)
Platelets: 233 K/uL (ref 150–400)
RBC: 4.07 MIL/uL (ref 3.87–5.11)
RDW: 13.5 % (ref 11.5–15.5)
WBC: 6.4 K/uL (ref 4.0–10.5)
nRBC: 0 % (ref 0.0–0.2)

## 2024-01-13 LAB — BASIC METABOLIC PANEL WITH GFR
Anion gap: 13 (ref 5–15)
BUN: 8 mg/dL (ref 8–23)
CO2: 23 mmol/L (ref 22–32)
Calcium: 7.5 mg/dL — ABNORMAL LOW (ref 8.9–10.3)
Chloride: 100 mmol/L (ref 98–111)
Creatinine, Ser: 0.61 mg/dL (ref 0.44–1.00)
GFR, Estimated: >60 mL/min
Glucose, Bld: 106 mg/dL — ABNORMAL HIGH (ref 70–99)
Potassium: 3 mmol/L — ABNORMAL LOW (ref 3.5–5.1)
Sodium: 136 mmol/L (ref 135–145)

## 2024-01-13 LAB — TSH: TSH: 2.53 u[IU]/mL (ref 0.350–4.500)

## 2024-01-13 LAB — MAGNESIUM: Magnesium: 1.8 mg/dL (ref 1.7–2.4)

## 2024-01-13 MED ORDER — ONDANSETRON HCL 4 MG/2ML IJ SOLN
4.0000 mg | Freq: Four times a day (QID) | INTRAMUSCULAR | Status: DC | PRN
  Administered 2024-01-13 – 2024-01-14 (×2): 4 mg via INTRAVENOUS
  Filled 2024-01-13 (×3): qty 2

## 2024-01-13 MED ORDER — APIXABAN 2.5 MG PO TABS
2.5000 mg | ORAL_TABLET | Freq: Two times a day (BID) | ORAL | Status: DC
  Administered 2024-01-13 – 2024-01-15 (×5): 2.5 mg via ORAL
  Filled 2024-01-13 (×4): qty 1

## 2024-01-13 MED ORDER — MAGNESIUM SULFATE IN D5W 1-5 GM/100ML-% IV SOLN
1.0000 g | Freq: Once | INTRAVENOUS | Status: AC
  Administered 2024-01-13: 1 g via INTRAVENOUS
  Filled 2024-01-13: qty 100

## 2024-01-13 MED ORDER — BISACODYL 5 MG PO TBEC
10.0000 mg | DELAYED_RELEASE_TABLET | Freq: Once | ORAL | Status: AC
  Administered 2024-01-13: 10 mg via ORAL
  Filled 2024-01-13: qty 2

## 2024-01-13 MED ORDER — POTASSIUM CHLORIDE CRYS ER 20 MEQ PO TBCR
40.0000 meq | EXTENDED_RELEASE_TABLET | Freq: Four times a day (QID) | ORAL | Status: AC
  Administered 2024-01-13 (×3): 40 meq via ORAL
  Filled 2024-01-13 (×3): qty 2

## 2024-01-13 MED ORDER — POLYETHYLENE GLYCOL 3350 17 G PO PACK: 17.0000 g | PACK | Freq: Every day | ORAL | Status: DC | PRN

## 2024-01-13 MED ORDER — METOPROLOL TARTRATE 12.5 MG HALF TABLET
12.5000 mg | ORAL_TABLET | Freq: Two times a day (BID) | ORAL | Status: DC
  Administered 2024-01-13 – 2024-01-15 (×5): 12.5 mg via ORAL
  Filled 2024-01-13 (×4): qty 1

## 2024-01-13 NOTE — Consult Note (Addendum)
 "  Cardiology Consultation   Patient ID: Heidi Spence MRN: [id number]; DOB: 11-22-1940  Admit date: 01/11/2024 Date of Consult: 01/13/2024  PCP:  Godwin Shed, MD   [facility] Providers Cardiologist:  Redell Shallow, MD   Patient Profile: Heidi Spence is a 83 y.o. female with a hx of heart failure with recovered EF, reported SVT, COPD, hypertension, hyperlipidemia, pulmonary nodule, bronchiectasis, trigeminal neuralgia, osteoporosis who is being seen 01/13/2024 for the evaluation of new onset atrial fibrillation at the request of Dr. Sherlon.  History of Present Illness: Heidi Spence was previously followed by [facility] for history of palpitations and SVT.  Reportedly had a echocardiogram 10/2020 with reported EF of 40 to 45%.  There admitted 06/2022 for complaints of shortness of breath and echocardiogram obtained at that time had demonstrated recovery of EF.  During same admission she had a Myoview  that demonstrated no acute ischemia/infarction.  LVEF 90%.  She was last seen in office 03/2023 doing well from a cardiac standpoint.  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  Of note beta-blocker previously stopped secondary to fatigue and bradycardia with heart rates in the 40s.  Looks like she was on Toprol -XL 25 mg at that time.  Currently patient being evaluated after sustaining a fall when walking out of the bedroom, no LOC.  She was found to have influenza A and colitis.  She has suffered from T12 compression fracture with known osteoporosis.  Fall was felt to be related to underlying flu, weakness and deconditioning.  She has been started on Tamiflu .  Had elevated D-dimer but no PE on CT, she had pulmonary nodules noted however.  Cardiology has been consulted for new onset atrial fibrillation and was noted last night, given diltiazem  bolus.  She has also had issues of low potassium  and sodium throughout this admission.  Troponins flat 25-26-27.  TSH normal.  Went to evaluate patient, she was napping and also speaks Lao.  Her daughter was available for interpretation and history.  However there is another daughter that primarily takes care of patient.  Daughter that is present today reports that she normally is very self-sufficient and able to ambulate and complete all of her ADLs without any difficulty.  She does not normally fall at home and this was a rare occurrence.  Additionally denies that patient lost any consciousness or had any trauma to the head.  No obvious signs of bleeding.  Prior to admission was not aware of any tachycardia, palpitations, dizziness.  Denied any chest pain, shortness of breath, peripheral edema.  Overall unaware of the episode of atrial fibrillation.   Past Medical History:  Diagnosis Date   Bronchiectasis (HCC)    CHF (congestive heart failure) (HCC)    COPD (chronic obstructive pulmonary disease) (HCC)    HLD (hyperlipidemia)    Hypertension    Prediabetes     Past Surgical History:  Procedure Laterality Date   No prior surgery       Scheduled Meds:  atorvastatin   40 mg Oral Daily   bisacodyl   10 mg Oral Once   diltiazem   10 mg Intravenous Once   heparin   5,000 Units Subcutaneous Q12H   oseltamivir   30 mg Oral BID   polyethylene glycol  17 g Oral Daily   potassium chloride   40 mEq Oral Q6H   sodium chloride  flush  3 mL Intravenous Q12H   Continuous Infusions:  cefTRIAXone  (ROCEPHIN )  IV 2 g (01/13/24 0958)   magnesium  sulfate bolus IVPB     PRN Meds: acetaminophen  **OR** acetaminophen , albuterol , HYDROcodone -acetaminophen , ipratropium-albuterol , magnesium  hydroxide, morphine  injection, ondansetron  (ZOFRAN ) IV, polyethylene glycol  Allergies:   Allergies[1]  Social History:   Social History   Socioeconomic History   Marital status: Widowed    Spouse name: Not on file   Number of children: 9   Years of education: Not  on file   Highest education level: Not on file  Occupational History   Not on file  Tobacco Use   Smoking status: Never   Smokeless tobacco: Not on file  Substance and Sexual Activity   Alcohol use: No   Drug use: No   Sexual activity: Not Currently    Partners: Male  Other Topics Concern   Not on file  Social History Narrative   Not on file   Social Drivers of Health   Tobacco Use: Low Risk (07/23/2023)   Received from [facility]   Patient History    Smoking Tobacco Use: Never    Smokeless Tobacco Use: Never    Passive Exposure: Not on file  Financial Resource Strain: Not on file  Food Insecurity: No Food Insecurity (01/11/2024)   Epic    Worried About Programme Researcher, Broadcasting/film/video in the Last Year: Never true    Ran Out of Food in the Last Year: Never true  Transportation Needs: No Transportation Needs (01/11/2024)   Epic    Lack of Transportation (Medical): No    Lack of Transportation (Non-Medical): No  Physical Activity: Not on file  Stress: Not on file  Social Connections: Unknown (01/11/2024)   Social Connection and Isolation Panel    Frequency of Communication with Friends and Family: Not on file    Frequency of Social Gatherings with Friends and Family: Not on file    Attends Religious Services: Not on file    Active Member of Clubs or Organizations: Not on file    Attends Banker Meetings: Not on file    Marital Status: Widowed  Intimate Partner Violence: Not At Risk (01/11/2024)   Epic    Fear of Current or Ex-Partner: No    Emotionally Abused: No    Physically Abused: No    Sexually Abused: No  Depression (PHQ2-9): Not on file  Alcohol Screen: Not on file  Housing: Unknown (01/11/2024)   Epic    Unable to Pay for Housing in the Last Year: No    Number of Times Moved in the Last Year: Not on file    Homeless in the Last Year: No  Utilities: Not At Risk (01/11/2024)   Epic    Threatened with loss of utilities: No  Health Literacy: Not on file     Family History:   Family History  Problem Relation Age of Onset   Kidney failure Mother      ROS:  Please see the history of present illness.  All other ROS reviewed and negative.     Physical Exam/Data: Vitals:   01/13/24 0437 01/13/24 0452 01/13/24 0700 01/13/24 0755  BP:   (!) 151/86   Pulse:  88    Resp:  16    Temp:   97.7 F (36.5 C)   TempSrc:   Oral   SpO2:  96%    Weight: 38.3 kg   35.1 kg    Intake/Output Summary (Last 24 hours) at 01/13/2024 1144 Last data filed at 01/13/2024 0800 Gross  per 24 hour  Intake 480 ml  Output 650 ml  Net -170 ml      01/13/2024    7:55 AM 01/13/2024    4:37 AM 05/06/2023   10:23 PM  Last 3 Weights  Weight (lbs) 77 lb 4.8 oz 84 lb 7 oz 76 lb 11.5 oz  Weight (kg) 35.063 kg 38.3 kg 34.8 kg     Body mass index is 15.1 kg/m.  General:  Well nourished, well developed, in no acute distress.  Frail HEENT: normal Neck: no JVD Vascular: No carotid bruits; Distal pulses 2+ bilaterally Cardiac:  normal S1, S2; RRR; no murmur.  PACs Lungs:  clear to auscultation bilaterally, no wheezing, rhonchi or rales  Abd: soft, nontender, no hepatomegaly  Ext: no edema Musculoskeletal:  No deformities, BUE and BLE strength normal and equal Skin: warm and dry  Neuro:  CNs 2-12 intact, no focal abnormalities noted Psych:  Normal affect   EKG:  The EKG was personally reviewed and demonstrates:   12/27 EKG demonstrates atrial fibrillation, heart rate 113.  Nonspecific ST-T wave changes. 12/26 EKG demonstrates sinus rhythm with T wave inversions inferolaterally. Telemetry:  Telemetry was personally reviewed and demonstrates: Atrial fibrillation.  Converted to sinus rhythm around 1000 this morning.  Relevant CV Studies: 07/03/2022 Myoview   No reversible ischemia or infarction.   2. Normal left ventricular wall motion.   3. Left ventricular ejection fraction 90%   4. Non invasive risk stratification*: Low   *2012 Appropriate Use Criteria  for Coronary Revascularization Focused Update: J Am Coll Cardiol. 2012;59(9):857-881.  07/01/2022 echocardiogram     ECHOCARDIOGRAM REPORT       Patient Name:   KASH MOTHERSHEAD Date of Exam: 07/01/2022 Medical Rec #:  [id number]   Height:       60.0 in Accession #:    [id number]  Weight:       76.9 lb Date of Birth:  01-18-1940   BSA:          1.244 m Patient Age:    81 years    BP:           122/58 mmHg Patient Gender: F           HR:           59 bpm. Exam Location:  Inpatient  Procedure: 2D Echo, 3D Echo, Cardiac Doppler and Color Doppler  Indications:    R00.8 Other abnormalities of heart beat   History:        Patient has no prior history of Echocardiogram examinations.                 Signs/Symptoms:Dyspnea and Shortness of Breath.   Sonographer:    Ellouise Mose RDCS Referring Phys: [phone] A CALDWELL POWELL JR    Sonographer Comments: Extremely thin habitus. Low parasternal. IMPRESSIONS    1. Left ventricular ejection fraction, by estimation, is 60 to 65%. The left ventricle has normal function. The left ventricle has no regional wall motion abnormalities. Left ventricular diastolic parameters are consistent with Grade I diastolic dysfunction (impaired relaxation).  2. Right ventricular systolic function is normal. The right ventricular size is normal. There is mildly elevated pulmonary artery systolic pressure.  3. Left atrial size was moderately dilated.  4. Right atrial size was mildly dilated.  5. The mitral valve is abnormal. Mild mitral valve regurgitation. No evidence of mitral stenosis.  6. Calcified non coronary cusp. The aortic valve is tricuspid. There is mild calcification of the aortic  valve. Aortic valve regurgitation is not visualized. Aortic valve sclerosis is present, with no evidence of aortic valve stenosis.  7. The inferior vena cava is normal in size with greater than 50% respiratory variability, suggesting right atrial pressure of 3 mmHg.     Laboratory Data: High Sensitivity Troponin:  No results for input(s): TROPONINIHS in the last 720 hours.  Recent Labs  Lab 01/11/24 1835 01/11/24 2041 01/12/24 0005  TRNPT 25* 26* 27*      Chemistry Recent Labs  Lab 01/11/24 1139 01/11/24 1835 01/12/24 0359 01/13/24 0544 01/13/24 0720  NA 133*  --  132* 136  --   K 3.7  --  3.3* 3.0*  --   CL 99  --  99 100  --   CO2 22  --  22 23  --   GLUCOSE 165*  --  91 106*  --   BUN 12  --  14 8  --   CREATININE 0.78  --  0.76 0.61  --   CALCIUM  8.8*  --  8.1* 7.5*  --   MG  --  1.8 1.9  --  1.8  GFRNONAA >60  --  >60 >60  --   ANIONGAP 12  --  11 13  --     Recent Labs  Lab 01/11/24 1139 01/12/24 0359  PROT 7.3 6.6  ALBUMIN 3.8 3.3*  AST 101* 67*  ALT 44 32  ALKPHOS 78 69  BILITOT 0.4 0.3   Lipids No results for input(s): CHOL, TRIG, HDL, LABVLDL, LDLCALC, CHOLHDL in the last 168 hours.  Hematology Recent Labs  Lab 01/11/24 1139 01/12/24 0359 01/13/24 0544  WBC 8.1 6.5 6.4  RBC 4.34 3.96 4.07  HGB 12.8 11.8* 11.9*  HCT 37.0 33.8* 34.5*  MCV 85.3 85.4 84.8  MCH 29.5 29.8 29.2  MCHC 34.6 34.9 34.5  RDW 13.4 13.4 13.5  PLT 183 191 233   Thyroid  Recent Labs  Lab 01/13/24 0720  TSH 2.530    BNPNo results for input(s): BNP, PROBNP in the last 168 hours.  DDimer  Recent Labs  Lab 01/11/24 1835  DDIMER 3.23*    Radiology/Studies:  CT Angio Chest PE W and/or Wo Contrast Result Date: 01/11/2024 EXAM: CTA CHEST 01/11/2024 10:47:21 PM TECHNIQUE: CTA of the chest was performed after the administration of intravenous contrast. Multiplanar reformatted images are provided for review. MIP images are provided for review. Automated exposure control, iterative reconstruction, and/or weight based adjustment of the mA/kV was utilized to reduce the radiation dose to as low as reasonably achievable. COMPARISON: Portable chest earlier today, CTA chest 05/07/2023, chest CT without contrast 06/22/2022.  CLINICAL HISTORY: Cough and congestion with a suspected pulmonary embolism. FINDINGS: PULMONARY ARTERIES: Pulmonary arteries are upper limits of normal in caliber with no evidence of thromboemboli. Mild dilatation in the superior pulmonary veins is again noted. MEDIASTINUM: There is mild to moderate cardiomegaly with a biatrial chamber predominance, particularly the left atrium. There is a prominent left atrial appendage. No pericardial effusion. There are scattered 3-vessel coronary calcifications. Mild to moderate patchy atherosclerosis of the aorta, minimal calcifications in the great vessels. There is no aneurysm, stenosis, or dissection. There is a small hiatal hernia. Mild thickening of the thoracic esophagus consistent with esophagitis without masslike thickening. There are borderline prominent pretracheal and precarinal lymph nodes, but no enlarged thoracic lymph nodes. LYMPH NODES: Borderline prominent pretracheal and precarinal lymph nodes. No enlarged thoracic lymph nodes. No axillary lymphadenopathy. LUNGS AND PLEURA: There are  mild features of centrilobular emphysema. Biapical and posterior upper lobe pleuroparenchymal scarring. There is a densely calcified granuloma in the superior segment of the left lower lobe. There is diffuse bronchial thickening especially in the right middle and lower lobes. There is fluid in the distal trachea, both main bronchi and left lower lobe bronchus. There are segmental and subsegmental bronchial impactions in both lower lobes, on the left associated with infrahilar cylindrical bronchiectasis suggesting a chronic inflammatory process including chronic aspiration. Additional cylindrical bronchiectasis and scattered mucous plugging noted laterally in the right lower lobe, posteriorly in the right middle lobe. There is a trace left pleural effusion. Coarse linear scarlike markings in both bases chronically. Mild patchy alveolar infiltrates noted laterally in the superior  segment of the left lower lobe without other focal pneumonic infiltrates. There is a new 7 mm irregular right upper lobe nodule posteriorly on series 6, axial 86. A 6 mm ground glass nodule laterally in the left upper lobe on axial 59. Possibly these may be inflammatory nodules, but follow-up imaging will be needed as well as to ensure clearing of the left lower lobe infiltrate. No pneumothorax. UPPER ABDOMEN: Limited images of the upper abdomen are unremarkable. SOFT TISSUES AND BONES: There is osteopenia, kyphosis, and degenerative change of the thoracic spine. There is a new, subacute but not chronic-appearing upper plate anterior wedge compression fracture of the T12 vertebral body, with anterior cortical buckling without retropulsion and a transverse fracture component extending from anterior to posterior within the vertebral body. Loss of vertebral height about 10 to 15 percent anteriorly and 5 to 10 percent posteriorly. There is no retropulsion. No other significant osseous findings. No acute soft tissue abnormality. IMPRESSION: 1. No evidence of pulmonary embolism. Borderline prominent pulmonary arteries. . 2. Left lower lobe mild patchy infiltrate with bronchial thickening, mucus plugging, and bronchiectasis, concerning for aspiration/infectious bronchiolitis. 3. New 7 mm irregular right upper lobe nodule and 6 mm left upper lobe ground-glass nodule; recommend non-contrast chest CT at 3-6 months, then repeat non-contrast chest CT at 18-24 months, as per Fleischner Society Guidelines. 4. Mild to moderate cardiomegaly with biatrial chamber predominance. No pericardial effusion. 5. New subacute T12 vertebral body compression fracture. No retropulsion. 6. Aortic and coronary artery atherosclerosis. Electronically signed by: Francis Quam MD 01/11/2024 11:47 PM EST RP Workstation: [id number]   CT ABDOMEN PELVIS W CONTRAST Result Date: 01/11/2024 EXAM: CT ABDOMEN AND PELVIS WITH CONTRAST 01/11/2024 01:42:59  PM TECHNIQUE: CT of the abdomen and pelvis was performed with the administration of 60 mL of iohexol  (OMNIPAQUE ) 350 MG/ML injection. Multiplanar reformatted images are provided for review. Automated exposure control, iterative reconstruction, and/or weight-based adjustment of the mA/kV was utilized to reduce the radiation dose to as low as reasonably achievable. COMPARISON: None available. CLINICAL HISTORY: Abdominal pain, acute, nonlocalized. FINDINGS: LOWER CHEST: Diffuse bronchial wall thickening in the lung bases with mucous plugging and centrilobular and tree in bud nodularity scattered throughout the visualized lungs. Mild cardiomegaly with multivessel coronary atherosclerosis. LIVER: The liver is unremarkable. GALLBLADDER AND BILE DUCTS: Gallbladder is unremarkable. No biliary ductal dilatation. SPLEEN: No acute abnormality. PANCREAS: No acute abnormality. ADRENAL GLANDS: No acute abnormality. KIDNEYS, URETERS AND BLADDER: Multifocal cortical scarring in the kidneys bilaterally. No stones in the kidneys or ureters. No hydronephrosis. No perinephric or periureteral stranding. Urinary bladder is unremarkable. GI AND BOWEL: Total colonic diverticulosis. Subtle wall thickening and inflammatory stranding along the proximal ascending colon. Stomach demonstrates no acute abnormality. There is no bowel obstruction. PERITONEUM AND RETROPERITONEUM:  No ascites. No free air. No free pelvic fluid. VASCULATURE: Diffuse aortoiliac atherosclerosis. Aorta is normal in caliber. LYMPH NODES: No lymphadenopathy. REPRODUCTIVE ORGANS: Age related atrophy of the uterus and ovaries. BONES AND SOFT TISSUES: Osteopenia. Mild compression fracture of T12. No retropulsion. No focal soft tissue abnormality. IMPRESSION: 1. Mild compression fracture of T12, favored to be acute. No retropulsion. 2. Subtle wall thickening and inflammatory stranding along the proximal ascending colon, which may reflect changes of acute diverticulitis versus  an infectious or inflammatory colitis. 3. Similar findings in the lung bases, likely reflecting changes of chronic atypical infection, such as MAI. Electronically signed by: Rogelia Myers MD 01/11/2024 02:24 PM EST RP Workstation: [id number]   DG Femur Portable 1 View Right Result Date: 01/11/2024 CLINICAL DATA:  Fall. EXAM: RIGHT FEMUR PORTABLE 1 VIEW COMPARISON:  None Available. FINDINGS: One-view portable exam of the right femur shows no evidence for an acute fracture. Mild degenerative changes are noted at the knee. No worrisome lytic or sclerotic osseous abnormality. IMPRESSION: 1. No acute bony findings on this single one-view portable exam. 2. Mild degenerative changes at the knee. Electronically Signed   By: Camellia Candle M.D.   On: 01/11/2024 12:23   DG Chest Portable 1 View Result Date: 01/11/2024 EXAM: 1 VIEW(S) XRAY OF THE CHEST 01/11/2024 11:48:00 AM COMPARISON: 05/06/2023 CLINICAL HISTORY: 83 year old female with cough. FINDINGS: LUNGS AND PLEURA: Hyperinflation of lungs. Chronic coarsened interstitial markings without pulmonary edema. Similar-appearing upper lobe nodules. Chronic calcified granuloma in the superior segment of the left lower lobe. Coarse bilateral pulmonary interstitial opacities appears chronic and stable. No pleural effusion. No pneumothorax. HEART AND MEDIASTINUM: Aortic calcification. No acute abnormality of the cardiac and mediastinal silhouettes. BONES AND SOFT TISSUES: No acute osseous abnormality. IMPRESSION: 1. COPD.  No acute cardiopulmonary abnormality. Electronically signed by: Helayne Hurst MD 01/11/2024 12:22 PM EST RP Workstation: [id number]   DG Pelvis Portable Result Date: 01/11/2024 CLINICAL DATA:  Fall. EXAM: PORTABLE PELVIS 1-2 VIEWS COMPARISON:  None Available. FINDINGS: Bones are demineralized. No evidence for superior or inferior pubic ramus fracture. No femoral head dislocation. No evidence for femoral neck fracture on this one view study with limited  assessment of the right femoral neck due to positioning. IMPRESSION: 1. No acute bony findings. 2. Limited assessment of the right femoral neck due to positioning. Electronically Signed   By: Camellia Candle M.D.   On: 01/11/2024 12:21     Assessment and Plan:  New onset PAF She arrived in sinus rhythm.  Yesterday found to be in atrial fibrillation with heart rates into the 140s.  Since approximately 1000 today she has been converted to sinus rhythm with frequent PACs which may make the rhythm look irregular.  Seems to be unaware of her atrial fibrillation.  Looks euvolemic on exam.  Her fall, low potassium, influenza A likely the driver of her arrhythmia. We will start IV heparin  for the time being before committing her to anticoagulation.  She is very frail with a BMI of 15.  However her daughter reassures me that she has no history of falls and generally self-sufficient and independent at home. Will start low-dose Lopressor  12.5 mg twice daily.  Previously Toprol -XL 25 mg stopped due to bradycardia with heart rates in the 40s and fatigue.  Will see how she tolerates. TSH normal this admission. Obtain echocardiogram. At discharge could consider heart monitor to further assess atrial fibrillation and help guide decision making as far as anticoagulation. Continue to treat electrolytes aggressively  Heart failure with recovered EF Reportedly at [facility] had reduced EF but EF since 06/2022 has been recovered.  Looks euvolemic.  Obtain echocardiogram.  Abnormal EKG She has ischemic appearing EKG with inferolateral T wave inversions.  However no symptoms currently to suggest underlying obstructive CAD.  Myoview  from 06/2022 was normal.  Influenza A- on Tamiflu  T12 compression fracture secondary to fall BMI 15   Risk Assessment/Risk Scores:   CHA2DS2-VASc Score = 4  This indicates a 4.8% annual risk of stroke. The patient's score is based upon: CHF History: 1 HTN History: 0 Diabetes History:  0 Stroke History: 0 Vascular Disease History: 0 Age Score: 2 Gender Score: 1    For questions or updates, please contact [facility] Please consult [email or web address] for contact info under      Signed, Thom LITTIE Sluder, PA-C  01/13/2024 11:44 AM      [1]  Allergies Allergen Reactions   Ciprofloxacin Rash   Wound Dressing Adhesive Other (See Comments)    Irritation if left on too long.   "

## 2024-01-13 NOTE — Progress Notes (Addendum)
 PHARMACY - ANTICOAGULATION CONSULT NOTE  Pharmacy Consult for Apixaban  Indication: atrial fibrillation  Allergies[1]  Patient Measurements: Weight: 35.1 kg (77 lb 4.8 oz)  Vital Signs: Temp: 97.7 F (36.5 C) (12/28 0700) Temp Source: Oral (12/28 0700) BP: 151/86 (12/28 0700) Pulse Rate: 88 (12/28 0452)  Labs: Recent Labs    01/11/24 1139 01/11/24 1835 01/12/24 0359 01/13/24 0544  HGB 12.8  --  11.8* 11.9*  HCT 37.0  --  33.8* 34.5*  PLT 183  --  191 233  CREATININE 0.78  --  0.76 0.61  CKTOTAL  --  92  --   --     CrCl cannot be calculated (Unknown ideal weight.).   Medical History: Past Medical History:  Diagnosis Date   Bronchiectasis (HCC)    CHF (congestive heart failure) (HCC)    COPD (chronic obstructive pulmonary disease) (HCC)    HLD (hyperlipidemia)    Hypertension    Prediabetes     Medications:  Medications Prior to Admission  Medication Sig Dispense Refill Last Dose/Taking   alendronate (FOSAMAX) 70 MG tablet Take 70 mg by mouth once a week.   Past Week   aspirin  EC 81 MG tablet Take 1 tablet (81 mg total) by mouth daily. Swallow whole. 30 tablet 1 01/11/2024   atorvastatin  (LIPITOR) 40 MG tablet Take 40 mg by mouth daily.   01/11/2024   azithromycin (ZITHROMAX) 250 MG tablet Take 250 mg by mouth every Monday, Wednesday, and Friday.   01/11/2024   Cranberry 200 MG CAPS Take 200 mg by mouth daily.   Unknown   dextromethorphan-guaiFENesin (MUCINEX DM) 30-600 MG 12hr tablet Take 1 tablet by mouth daily.   01/11/2024   furosemide  (LASIX ) 20 MG tablet Take 20 mg by mouth daily.   01/11/2024   gabapentin (NEURONTIN) 300 MG capsule Take 300 mg by mouth daily.   01/11/2024   ipratropium-albuterol  (DUONEB) 0.5-2.5 (3) MG/3ML SOLN Take 3 mLs by nebulization every 6 (six) hours as needed (shortness of breath). 360 mL 1 Past Week   lisinopril (ZESTRIL) 40 MG tablet Take 40 mg by mouth daily.   01/11/2024   loratadine (CLARITIN) 10 MG tablet Take 10 mg by  mouth daily as needed for allergies or rhinitis.   Unknown   Multiple Vitamin (MULTIVITAMIN WITH MINERALS) TABS tablet Take 1 tablet by mouth daily.   01/11/2024   White Petrolatum-Mineral Oil (SOOTHE NIGHTTIME) OINT Place 1 Application into both eyes at bedtime.   Unknown    Assessment: 83 yo female presenting after a fall. While admitted pt develop new onset Afib w/ RVR. Cardiology recommending apixaban . Pt not on anticoagulation PTA. Pt is not considered at increased risk of falls as this was a rare occurrence precipitated by weakness from influenza A infection, pneumonia, and colitis. Pt currently on 5k units SQH for prophylaxis.   Plan to start pt on reduced dose apixaban  as she is > 5 yo and < 60 kg.   Hgb 11.9 and PLT 233  Goal of Therapy:  Monitor platelets by anticoagulation protocol: Yes   Plan:  Stop SQH Start apixaban  2.5 mg PO BID  Monitor CBC and signs of bleeding FU Eliquis  pricing  FU apixaban  education closer to discharge   Prentice DOROTHA Favors, PharmD PGY1 Health-System Pharmacy Administration and Leadership Resident [facility]  01/13/2024 12:04 PM       [1]  Allergies Allergen Reactions   Ciprofloxacin Rash   Wound Dressing Adhesive Other (See Comments)    Irritation  if left on too long.

## 2024-01-13 NOTE — Plan of Care (Signed)

## 2024-01-13 NOTE — Progress Notes (Signed)
 Pt continues to be in Afib-asymptomatic. 90s to low 100s when in bed. And 120-140s When up to the commode.-Vitals remain stable. MD Aware

## 2024-01-13 NOTE — Progress Notes (Signed)
 " PROGRESS NOTE    Heidi Spence  FMW:[id number] DOB: Jul 30, 1940 DOA: 01/11/2024 PCP: Godwin Shed, MD   Chief Complaint  Patient presents with   Fall    Brief Narrative:  Heidi Spence is a 83 y.o. female with past medical history  of  h/o HTN, Hyperlipidemia, Chronic Systolic CHF, SVT, Bradycardia, CKD Stage 3a, Pulmonary Nodule, Bronchiectasis, Chest Discomfort, Trigeminal Neuralgia, Osteoporosis, Hyponatremia, Vitamin D Deficiency, Allergic Rhinitis, Bilat. Hearing Loss, Bilat. Tinnitus, Insomnia, Depression brought from home for --fall on right side while walking out of bedroom. No LOC or head injury or blood thinners.  Workup significant for influenza A and colitis.    Assessment & Plan:   Principal Problem:   Falls, initial encounter  Fall T12 compression fracture Osteoporosis -Fall secondary to weakness, deconditioning in the setting of acute illness from influenza A infection, pneumonia and colitis-continue with PT/OT. - Pain is controlled -History of osteoporosis, continue with Fosamax. -PT/OT consulted. -TLSO brace while sitting up and with activity.  Influenza A infection Viral pneumonia versus bacterial pneumonia Acute colitis -Congestion, positive influenza A, imaging significant for plugging, questionable aspiration versus acute bronchiolitis. - Was encouraged with incentive spirometry and flutter valve with evidence of mucous plugging. - Continue with IV Rocephin  and Flagyl  to cover for both colitis and pneumonia - Continue with Tamiflu  - Report nausea, this is most likely in the setting of her viral illness, as well severe constipation and initiating some laxatives, but I will go ahead and DC her IV Flagyl  at it may be contributing as well.   New onset A-fib -Apparently patient developed A-fib with RVR overnight plan was for Cardizem  drip, but heart rate got controlled on IV Cardizem  pushes. - History of SVT with intolerant to beta-blockers causing fatigue and  bradycardia. - TSH within normal limit. - 2D echo in June 2024, with a preserved EF 60 to 65%, no regional wall motion abnormalities,.  Will await input to see if will need to repeat. - cardiology consulted egarding recommendations and heart rate controlling regimen  Elevated D-dimers -Most likely acute elevated inflammatory markers in the setting of lonesome infection. - CTA negative for PE  Hyponatremia -Volume depletion, resolved with IV fluid  hypokalemia Hypophosphatemia - Replaced, monitor closely.         DVT prophylaxis: (Coco heaptin) Code Status: (Full) Family Communication: (D/W daughter at bedside) Disposition: PT/OT pending, likely will discharge home tomorrow in a.m. on p.o. antibiotic if oral intake improves, and her labs within normal limit.    Consultants:  cardiology   Subjective:  Developed some nausea, still no bowel movements, last BM was 6 days ago, she developed A-fib with RVR overnight.  Objective: Vitals:   01/13/24 0437 01/13/24 0452 01/13/24 0700 01/13/24 0755  BP:   (!) 151/86   Pulse:  88    Resp:  16    Temp:   97.7 F (36.5 C)   TempSrc:   Oral   SpO2:  96%    Weight: 38.3 kg   35.1 kg    Intake/Output Summary (Last 24 hours) at 01/13/2024 1137 Last data filed at 01/13/2024 0800 Gross per 24 hour  Intake 480 ml  Output 650 ml  Net -170 ml   Filed Weights   01/13/24 0437 01/13/24 0755  Weight: 38.3 kg 35.1 kg    Examination:  Awake, alert, frail, no apparent distress Proved air entry at the bases, but still with some lung Rales Irregular irregular Abdomen soft Lower extremities with no edema  Data Reviewed: I have personally reviewed following labs and imaging studies  CBC: Recent Labs  Lab 01/11/24 1130 01/11/24 1139 01/12/24 0359 01/13/24 0544  WBC  --  8.1 6.5 6.4  NEUTROABS  --  6.9  --   --   HGB 13.3 12.8 11.8* 11.9*  HCT 39.0 37.0 33.8* 34.5*  MCV  --  85.3 85.4 84.8  PLT  --  183 191 233     Basic Metabolic Panel: Recent Labs  Lab 01/11/24 1130 01/11/24 1139 01/11/24 1835 01/12/24 0359 01/13/24 0544 01/13/24 0720  NA 135 133*  --  132* 136  --   K 3.7 3.7  --  3.3* 3.0*  --   CL 101 99  --  99 100  --   CO2  --  22  --  22 23  --   GLUCOSE 166* 165*  --  91 106*  --   BUN 12 12  --  14 8  --   CREATININE 0.80 0.78  --  0.76 0.61  --   CALCIUM   --  8.8*  --  8.1* 7.5*  --   MG  --   --  1.8 1.9  --  1.8  PHOS  --   --   --  2.3*  --   --     GFR: CrCl cannot be calculated (Unknown ideal weight.).  Liver Function Tests: Recent Labs  Lab 01/11/24 1139 01/12/24 0359  AST 101* 67*  ALT 44 32  ALKPHOS 78 69  BILITOT 0.4 0.3  PROT 7.3 6.6  ALBUMIN 3.8 3.3*    CBG: No results for input(s): GLUCAP in the last 168 hours.   Recent Results (from the past 240 hours)  Resp panel by RT-PCR (RSV, Flu A&B, Covid) Anterior Nasal Swab     Status: Abnormal   Collection Time: 01/11/24 11:26 AM   Specimen: Anterior Nasal Swab  Result Value Ref Range Status   SARS Coronavirus 2 by RT PCR NEGATIVE NEGATIVE Final   Influenza A by PCR POSITIVE (A) NEGATIVE Final   Influenza B by PCR NEGATIVE NEGATIVE Final    Comment: (NOTE) The Xpert Xpress SARS-CoV-2/FLU/RSV plus assay is intended as an aid in the diagnosis of influenza from Nasopharyngeal swab specimens and should not be used as a sole basis for treatment. Nasal washings and aspirates are unacceptable for Xpert Xpress SARS-CoV-2/FLU/RSV testing.  Fact Sheet for Patients: bloggercourse.com  Fact Sheet for Healthcare Providers: seriousbroker.it  This test is not yet approved or cleared by the [place]  FDA and has been authorized for detection and/or diagnosis of SARS-CoV-2 by FDA under an Emergency Use Authorization (EUA). This EUA will remain in effect (meaning this test can be used) for the duration of the COVID-19 declaration under Section 564(b)(1)  of the Act, 21 U.S.C. section 360bbb-3(b)(1), unless the authorization is terminated or revoked.     Resp Syncytial Virus by PCR NEGATIVE NEGATIVE Final    Comment: (NOTE) Fact Sheet for Patients: bloggercourse.com  Fact Sheet for Healthcare Providers: seriousbroker.it  This test is not yet approved or cleared by the [place]  FDA and has been authorized for detection and/or diagnosis of SARS-CoV-2 by FDA under an Emergency Use Authorization (EUA). This EUA will remain in effect (meaning this test can be used) for the duration of the COVID-19 declaration under Section 564(b)(1) of the Act, 21 U.S.C. section 360bbb-3(b)(1), unless the authorization is terminated or revoked.  Performed at [facility], [address], [place], [place] [address]          Radiology Studies: CT Angio Chest PE W and/or Wo Contrast Result Date: 01/11/2024 EXAM: CTA CHEST 01/11/2024 10:47:21 PM TECHNIQUE: CTA of the chest was performed after the administration of intravenous contrast. Multiplanar reformatted images are provided for review. MIP images are provided for review. Automated exposure control, iterative reconstruction, and/or weight based adjustment of the mA/kV was utilized to reduce the radiation dose to as low as reasonably achievable. COMPARISON: Portable chest earlier today, CTA chest 05/07/2023, chest CT without contrast 06/22/2022. CLINICAL HISTORY: Cough and congestion with a suspected pulmonary embolism. FINDINGS: PULMONARY ARTERIES: Pulmonary arteries are upper limits of normal in caliber with no evidence of thromboemboli. Mild dilatation in the superior pulmonary veins is again noted. MEDIASTINUM: There is mild to moderate cardiomegaly with a biatrial chamber predominance, particularly the left atrium. There is a prominent left atrial appendage. No pericardial effusion. There are scattered 3-vessel coronary calcifications. Mild to  moderate patchy atherosclerosis of the aorta, minimal calcifications in the great vessels. There is no aneurysm, stenosis, or dissection. There is a small hiatal hernia. Mild thickening of the thoracic esophagus consistent with esophagitis without masslike thickening. There are borderline prominent pretracheal and precarinal lymph nodes, but no enlarged thoracic lymph nodes. LYMPH NODES: Borderline prominent pretracheal and precarinal lymph nodes. No enlarged thoracic lymph nodes. No axillary lymphadenopathy. LUNGS AND PLEURA: There are mild features of centrilobular emphysema. Biapical and posterior upper lobe pleuroparenchymal scarring. There is a densely calcified granuloma in the superior segment of the left lower lobe. There is diffuse bronchial thickening especially in the right middle and lower lobes. There is fluid in the distal trachea, both main bronchi and left lower lobe bronchus. There are segmental and subsegmental bronchial impactions in both lower lobes, on the left associated with infrahilar cylindrical bronchiectasis suggesting a chronic inflammatory process including chronic aspiration. Additional cylindrical bronchiectasis and scattered mucous plugging noted laterally in the right lower lobe, posteriorly in the right middle lobe. There is a trace left pleural effusion. Coarse linear scarlike markings in both bases chronically. Mild patchy alveolar infiltrates noted laterally in the superior segment of the left lower lobe without other focal pneumonic infiltrates. There is a new 7 mm irregular right upper lobe nodule posteriorly on series 6, axial 86. A 6 mm ground glass nodule laterally in the left upper lobe on axial 59. Possibly these may be inflammatory nodules, but follow-up imaging will be needed as well as to ensure clearing of the left lower lobe infiltrate. No pneumothorax. UPPER ABDOMEN: Limited images of the upper abdomen are unremarkable. SOFT TISSUES AND BONES: There is osteopenia,  kyphosis, and degenerative change of the thoracic spine. There is a new, subacute but not chronic-appearing upper plate anterior wedge compression fracture of the T12 vertebral body, with anterior cortical buckling without retropulsion and a transverse fracture component extending from anterior to posterior within the vertebral body. Loss of vertebral height about 10 to 15 percent anteriorly and 5 to 10 percent posteriorly. There is no retropulsion. No other significant osseous findings. No acute soft tissue abnormality. IMPRESSION: 1. No evidence of pulmonary embolism. Borderline prominent pulmonary arteries. . 2. Left lower lobe mild patchy infiltrate with bronchial thickening, mucus plugging, and bronchiectasis, concerning for aspiration/infectious bronchiolitis. 3. New 7 mm irregular right upper lobe nodule and 6 mm left upper lobe ground-glass nodule; recommend non-contrast chest CT at 3-6 months, then repeat non-contrast chest CT at 18-24 months, as per Fleischner Society Guidelines.  4. Mild to moderate cardiomegaly with biatrial chamber predominance. No pericardial effusion. 5. New subacute T12 vertebral body compression fracture. No retropulsion. 6. Aortic and coronary artery atherosclerosis. Electronically signed by: Francis Quam MD 01/11/2024 11:47 PM EST RP Workstation: [id number]   CT ABDOMEN PELVIS W CONTRAST Result Date: 01/11/2024 EXAM: CT ABDOMEN AND PELVIS WITH CONTRAST 01/11/2024 01:42:59 PM TECHNIQUE: CT of the abdomen and pelvis was performed with the administration of 60 mL of iohexol  (OMNIPAQUE ) 350 MG/ML injection. Multiplanar reformatted images are provided for review. Automated exposure control, iterative reconstruction, and/or weight-based adjustment of the mA/kV was utilized to reduce the radiation dose to as low as reasonably achievable. COMPARISON: None available. CLINICAL HISTORY: Abdominal pain, acute, nonlocalized. FINDINGS: LOWER CHEST: Diffuse bronchial wall thickening in the  lung bases with mucous plugging and centrilobular and tree in bud nodularity scattered throughout the visualized lungs. Mild cardiomegaly with multivessel coronary atherosclerosis. LIVER: The liver is unremarkable. GALLBLADDER AND BILE DUCTS: Gallbladder is unremarkable. No biliary ductal dilatation. SPLEEN: No acute abnormality. PANCREAS: No acute abnormality. ADRENAL GLANDS: No acute abnormality. KIDNEYS, URETERS AND BLADDER: Multifocal cortical scarring in the kidneys bilaterally. No stones in the kidneys or ureters. No hydronephrosis. No perinephric or periureteral stranding. Urinary bladder is unremarkable. GI AND BOWEL: Total colonic diverticulosis. Subtle wall thickening and inflammatory stranding along the proximal ascending colon. Stomach demonstrates no acute abnormality. There is no bowel obstruction. PERITONEUM AND RETROPERITONEUM: No ascites. No free air. No free pelvic fluid. VASCULATURE: Diffuse aortoiliac atherosclerosis. Aorta is normal in caliber. LYMPH NODES: No lymphadenopathy. REPRODUCTIVE ORGANS: Age related atrophy of the uterus and ovaries. BONES AND SOFT TISSUES: Osteopenia. Mild compression fracture of T12. No retropulsion. No focal soft tissue abnormality. IMPRESSION: 1. Mild compression fracture of T12, favored to be acute. No retropulsion. 2. Subtle wall thickening and inflammatory stranding along the proximal ascending colon, which may reflect changes of acute diverticulitis versus an infectious or inflammatory colitis. 3. Similar findings in the lung bases, likely reflecting changes of chronic atypical infection, such as MAI. Electronically signed by: Rogelia Myers MD 01/11/2024 02:24 PM EST RP Workstation: [id number]   DG Femur Portable 1 View Right Result Date: 01/11/2024 CLINICAL DATA:  Fall. EXAM: RIGHT FEMUR PORTABLE 1 VIEW COMPARISON:  None Available. FINDINGS: One-view portable exam of the right femur shows no evidence for an acute fracture. Mild degenerative changes are  noted at the knee. No worrisome lytic or sclerotic osseous abnormality. IMPRESSION: 1. No acute bony findings on this single one-view portable exam. 2. Mild degenerative changes at the knee. Electronically Signed   By: Camellia Candle M.D.   On: 01/11/2024 12:23   DG Chest Portable 1 View Result Date: 01/11/2024 EXAM: 1 VIEW(S) XRAY OF THE CHEST 01/11/2024 11:48:00 AM COMPARISON: 05/06/2023 CLINICAL HISTORY: 83 year old female with cough. FINDINGS: LUNGS AND PLEURA: Hyperinflation of lungs. Chronic coarsened interstitial markings without pulmonary edema. Similar-appearing upper lobe nodules. Chronic calcified granuloma in the superior segment of the left lower lobe. Coarse bilateral pulmonary interstitial opacities appears chronic and stable. No pleural effusion. No pneumothorax. HEART AND MEDIASTINUM: Aortic calcification. No acute abnormality of the cardiac and mediastinal silhouettes. BONES AND SOFT TISSUES: No acute osseous abnormality. IMPRESSION: 1. COPD.  No acute cardiopulmonary abnormality. Electronically signed by: Helayne Hurst MD 01/11/2024 12:22 PM EST RP Workstation: [id number]   DG Pelvis Portable Result Date: 01/11/2024 CLINICAL DATA:  Fall. EXAM: PORTABLE PELVIS 1-2 VIEWS COMPARISON:  None Available. FINDINGS: Bones are demineralized. No evidence for superior or inferior pubic ramus fracture. No femoral  head dislocation. No evidence for femoral neck fracture on this one view study with limited assessment of the right femoral neck due to positioning. IMPRESSION: 1. No acute bony findings. 2. Limited assessment of the right femoral neck due to positioning. Electronically Signed   By: Camellia Candle M.D.   On: 01/11/2024 12:21        Scheduled Meds:  atorvastatin   40 mg Oral Daily   diltiazem   10 mg Intravenous Once   heparin   5,000 Units Subcutaneous Q12H   oseltamivir   30 mg Oral BID   polyethylene glycol  17 g Oral Daily   potassium chloride   40 mEq Oral Q6H   sodium chloride  flush   3 mL Intravenous Q12H   Continuous Infusions:  cefTRIAXone  (ROCEPHIN )  IV 2 g (01/13/24 0958)   magnesium  sulfate bolus IVPB       LOS: 1 day        Brayton Lye, MD [facility]   To contact the attending provider between 7A-7P or the covering provider during after hours 7P-7A, please log into the web site [email or web address] and access using universal [place] password for that web site. If you do not have the password, please call the hospital operator.  01/13/2024, 11:37 AM   "

## 2024-01-14 ENCOUNTER — Inpatient Hospital Stay (HOSPITAL_COMMUNITY)

## 2024-01-14 ENCOUNTER — Other Ambulatory Visit (HOSPITAL_COMMUNITY): Payer: Self-pay

## 2024-01-14 ENCOUNTER — Telehealth (HOSPITAL_COMMUNITY): Payer: Self-pay | Admitting: Pharmacy Technician

## 2024-01-14 ENCOUNTER — Other Ambulatory Visit: Payer: Self-pay

## 2024-01-14 ENCOUNTER — Encounter (HOSPITAL_COMMUNITY): Payer: Self-pay | Admitting: Internal Medicine

## 2024-01-14 DIAGNOSIS — I4891 Unspecified atrial fibrillation: Secondary | ICD-10-CM

## 2024-01-14 DIAGNOSIS — I480 Paroxysmal atrial fibrillation: Secondary | ICD-10-CM | POA: Diagnosis not present

## 2024-01-14 DIAGNOSIS — I10 Essential (primary) hypertension: Secondary | ICD-10-CM | POA: Diagnosis not present

## 2024-01-14 DIAGNOSIS — W19XXXA Unspecified fall, initial encounter: Secondary | ICD-10-CM | POA: Diagnosis not present

## 2024-01-14 LAB — BASIC METABOLIC PANEL WITH GFR
Anion gap: 10 (ref 5–15)
BUN: 9 mg/dL (ref 8–23)
CO2: 23 mmol/L (ref 22–32)
Calcium: 7.9 mg/dL — ABNORMAL LOW (ref 8.9–10.3)
Chloride: 101 mmol/L (ref 98–111)
Creatinine, Ser: 0.74 mg/dL (ref 0.44–1.00)
GFR, Estimated: >60 mL/min
Glucose, Bld: 96 mg/dL (ref 70–99)
Potassium: 5.7 mmol/L — ABNORMAL HIGH (ref 3.5–5.1)
Sodium: 133 mmol/L — ABNORMAL LOW (ref 135–145)

## 2024-01-14 LAB — ECHOCARDIOGRAM COMPLETE
Area-P 1/2: 3.42 cm2
MV M vel: 7.16 m/s
MV Peak grad: 205.1 mmHg
MV VTI: 1.99 cm2
S' Lateral: 2.4 cm
Single Plane A2C EF: 64.9 %
Weight: 1236.8 [oz_av]

## 2024-01-14 LAB — CBC
HCT: 32.6 % — ABNORMAL LOW (ref 36.0–46.0)
Hemoglobin: 11.2 g/dL — ABNORMAL LOW (ref 12.0–15.0)
MCH: 29.5 pg (ref 26.0–34.0)
MCHC: 34.4 g/dL (ref 30.0–36.0)
MCV: 85.8 fL (ref 80.0–100.0)
Platelets: 277 K/uL (ref 150–400)
RBC: 3.8 MIL/uL — ABNORMAL LOW (ref 3.87–5.11)
RDW: 13.6 % (ref 11.5–15.5)
WBC: 10.2 K/uL (ref 4.0–10.5)
nRBC: 0 % (ref 0.0–0.2)

## 2024-01-14 LAB — MAGNESIUM: Magnesium: 2.5 mg/dL — ABNORMAL HIGH (ref 1.7–2.4)

## 2024-01-14 LAB — POTASSIUM: Potassium: 5.4 mmol/L — ABNORMAL HIGH (ref 3.5–5.1)

## 2024-01-14 LAB — PHOSPHORUS: Phosphorus: 1.3 mg/dL — ABNORMAL LOW (ref 2.5–4.6)

## 2024-01-14 MED ORDER — FLEET ENEMA RE ENEM
1.0000 | ENEMA | Freq: Once | RECTAL | Status: AC
  Administered 2024-01-14: 1 via RECTAL
  Filled 2024-01-14: qty 1

## 2024-01-14 MED ORDER — SODIUM ZIRCONIUM CYCLOSILICATE 10 G PO PACK
10.0000 g | PACK | Freq: Once | ORAL | Status: AC
  Administered 2024-01-14: 10 g via ORAL
  Filled 2024-01-14: qty 1

## 2024-01-14 MED ORDER — MAGIC MOUTHWASH
10.0000 mL | Freq: Four times a day (QID) | ORAL | Status: DC
  Administered 2024-01-14 – 2024-01-15 (×2): 10 mL via ORAL
  Filled 2024-01-14 (×6): qty 10

## 2024-01-14 MED ORDER — SODIUM ZIRCONIUM CYCLOSILICATE 10 G PO PACK: 10.0000 g | PACK | Freq: Once | ORAL | Status: DC

## 2024-01-14 MED ORDER — SODIUM PHOSPHATES 45 MMOLE/15ML IV SOLN
30.0000 mmol | Freq: Once | INTRAVENOUS | Status: AC
  Administered 2024-01-14: 30 mmol via INTRAVENOUS
  Filled 2024-01-14: qty 10

## 2024-01-14 MED ORDER — OYSTER SHELL CALCIUM/D3 500-5 MG-MCG PO TABS
1.0000 | ORAL_TABLET | Freq: Two times a day (BID) | ORAL | Status: DC
  Administered 2024-01-14 – 2024-01-15 (×3): 1 via ORAL
  Filled 2024-01-14 (×3): qty 1

## 2024-01-14 MED ORDER — MAGNESIUM CITRATE PO SOLN
1.0000 | Freq: Once | ORAL | Status: AC
  Administered 2024-01-14: 0.5 via ORAL
  Filled 2024-01-14: qty 296

## 2024-01-14 NOTE — Progress Notes (Addendum)
 "  Rounding Note   Patient Name: Heidi Spence Date of Encounter: 01/14/2024  [facility] Cardiologist: Redell Shallow, MD   Subjective  No cardiac complaints this morning. She is maintaining sinus rhythm. No chest pain, shortness of breath, or palpitations. Daughter is eager to get her home.  Scheduled Meds:  apixaban   2.5 mg Oral BID   atorvastatin   40 mg Oral Daily   calcium -vitamin D  1 tablet Oral BID   diltiazem   10 mg Intravenous Once   magnesium  citrate  1 Bottle Oral Once   metoprolol  tartrate  12.5 mg Oral BID   oseltamivir   30 mg Oral BID   polyethylene glycol  17 g Oral Daily   sodium chloride  flush  3 mL Intravenous Q12H   sodium phosphate   1 enema Rectal Once   Continuous Infusions:  sodium PHOSPHATE  IVPB (in mmol) 30 mmol (01/14/24 0832)   PRN Meds: acetaminophen  **OR** acetaminophen , albuterol , HYDROcodone -acetaminophen , ipratropium-albuterol , magnesium  hydroxide, morphine  injection, ondansetron  (ZOFRAN ) IV, polyethylene glycol   Vital Signs  Vitals:   01/13/24 1607 01/13/24 1800 01/13/24 1915 01/14/24 0800  BP: (!) 166/78  132/86 (!) 158/81  Pulse: 69  72 74  Resp: (!) 23  (!) 22 18  Temp: (!) 97.4 F (36.3 C) (!) 97.4 F (36.3 C) 97.6 F (36.4 C) 98.7 F (37.1 C)  TempSrc: Oral Oral Oral Axillary  SpO2: 98%  96% 93%  Weight:       No intake or output data in the 24 hours ending 01/14/24 1012    01/13/2024    7:55 AM 01/13/2024    4:37 AM 05/06/2023   10:23 PM  Last 3 Weights  Weight (lbs) 77 lb 4.8 oz 84 lb 7 oz 76 lb 11.5 oz  Weight (kg) 35.063 kg 38.3 kg 34.8 kg      Telemetry Normal sinus rhythm with rates in the 60s to 70s. - Personally Reviewed  ECG  No new ECG tracing today. - Personally Reviewed  Physical Exam  GEN: Thin frail 83 year old female resting comfortably in no acute distress.   Neck: No JVD. Cardiac: RRR. No murmurs, rubs, or gallops.  Respiratory: No increased work of breathing. Mild rhonchi noted  bilaterally. No wheezes or rhonchi.  MS: No edema; No deformity. Neuro:  No focal deficits.   Labs High Sensitivity Troponin:  No results for input(s): TROPONINIHS in the last 720 hours.  Recent Labs  Lab 01/11/24 1835 01/11/24 2041 01/12/24 0005  TRNPT 25* 26* 27*       Chemistry Recent Labs  Lab 01/11/24 1139 01/11/24 1835 01/12/24 0359 01/13/24 0544 01/13/24 0720 01/14/24 0408  NA 133*  --  132* 136  --  133*  K 3.7  --  3.3* 3.0*  --  5.7*  CL 99  --  99 100  --  101  CO2 22  --  22 23  --  23  GLUCOSE 165*  --  91 106*  --  96  BUN 12  --  14 8  --  9  CREATININE 0.78  --  0.76 0.61  --  0.74  CALCIUM  8.8*  --  8.1* 7.5*  --  7.9*  MG  --    < > 1.9  --  1.8 2.5*  PROT 7.3  --  6.6  --   --   --   ALBUMIN 3.8  --  3.3*  --   --   --   AST 101*  --  67*  --   --   --   ALT 44  --  32  --   --   --   ALKPHOS 78  --  69  --   --   --   BILITOT 0.4  --  0.3  --   --   --   GFRNONAA >60  --  >60 >60  --  >60  ANIONGAP 12  --  11 13  --  10   < > = values in this interval not displayed.    Lipids No results for input(s): CHOL, TRIG, HDL, LABVLDL, LDLCALC, CHOLHDL in the last 168 hours.  Hematology Recent Labs  Lab 01/12/24 0359 01/13/24 0544 01/14/24 0408  WBC 6.5 6.4 10.2  RBC 3.96 4.07 3.80*  HGB 11.8* 11.9* 11.2*  HCT 33.8* 34.5* 32.6*  MCV 85.4 84.8 85.8  MCH 29.8 29.2 29.5  MCHC 34.9 34.5 34.4  RDW 13.4 13.5 13.6  PLT 191 233 277   Thyroid  Recent Labs  Lab 01/13/24 0720  TSH 2.530    BNPNo results for input(s): BNP, PROBNP in the last 168 hours.  DDimer  Recent Labs  Lab 01/11/24 1835  DDIMER 3.23*     Radiology  No results found.  Cardiac Studies  Echocardiogram 07/01/2022: Impressions: 1. Left ventricular ejection fraction, by estimation, is 60 to 65%. The  left ventricle has normal function. The left ventricle has no regional  wall motion abnormalities. Left ventricular diastolic parameters are  consistent  with Grade I diastolic  dysfunction (impaired relaxation).   2. Right ventricular systolic function is normal. The right ventricular  size is normal. There is mildly elevated pulmonary artery systolic  pressure.   3. Left atrial size was moderately dilated.   4. Right atrial size was mildly dilated.   5. The mitral valve is abnormal. Mild mitral valve regurgitation. No  evidence of mitral stenosis.   6. Calcified non coronary cusp. The aortic valve is tricuspid. There is  mild calcification of the aortic valve. Aortic valve regurgitation is not  visualized. Aortic valve sclerosis is present, with no evidence of aortic  valve stenosis.   7. The inferior vena cava is normal in size with greater than 50%  respiratory variability, suggesting right atrial pressure of 3 mmHg.  _______________  Myoview  07/03/2022: Impressions: 1. No reversible ischemia or infarction. 2. Normal left ventricular wall motion. 3. Left ventricular ejection fraction 90% 4. Non invasive risk stratification*: Low  Patient Profile   83 y.o. female with a history of chronic HFrEF with EF as low as 40-45% in the past but improved to 60-65% on last Echo in 06/2022, paroxysmal SVT, hypertension, hyperlipidemia, prediabetes, COPD, bronchiectasis, trigeminal neuralgia, and osteoporosis who was admitted on 01/11/2024 after presenting with a fall resulting in a T12 compression fracture. Family also reported a mild fever at home and was found to have influenza A, viral vs bacterial pneumonia, and colitis. Cardiology was consulted on 01/13/2024 for new onset atrial fibrillation.  Assessment & Plan   New Onset Atrial Fibrillation History of SVT Patient has a history of SVT. She presented in sinus rhythm but then went into rapid atrial fibrillation with rates as high as the 140s in the setting of influenza A, viral vs bacterial pneumonia, and colitis. Spontaneously converted to sinus rhythm with frequent PACs. - Maintaining sinus  rhythm with rates in the 60s to 70s. - Echo pending.  - Continue Lopressor  12.5mg  twice daily. She previously had some mild  bradycardia with on higher doses of Metoprolol  so will need to watch this closely.  - CHA2DS2-VASc = 6 (aortoiliac atherosclerosis noted on CT, CHF, HTN, age x2, female). She has been started on Eliquis  2.5mg  twice daily (reduced dose due to age and weight). She is a very frail 83 year old but family states she is very active and does not usual fall. Falls seems to be due to acute illness. - Consider outpatient monitor to assess atrial fibrillation burden.   Chronic HFrEF with Improved EF EF as low as 40-45% in the past but has subsequently improved. Last Echo in 06/2022 showed LVEF of 60-65% with grade 1 diastolic dysfunction.  - Euvolemic on exam.  - Continue to hold home Lasix  for now. Can restart on discharge. - Continue low dose Lopressor  at home.  - On Lisinopril 40mg  daily at home but this was held on admission. Will continue to hold for now given hyperkalemia this morning. - If EF is reduced, can optimize GDMT.   Abnormal EKG Initial EKG on admission showed normal sinus rhythm with diffuse T wave inversions in inferior leads and leads V2-V6 with deep T wave inversions and slight ST depression in leads V3-V5. She has had similar changes on EKGs dating back to 06/2022. Echo at that time showed normal LV function and Myoview  was low risk with no evidence of ischemia or infarction. Repeat EKG on 12/27 while patient was in atrial fibrillation showed T wave inversions had essentially resolved. - No chest pain or anginal symptoms.  - Echo pending. Do not anticipate any need for additional ischemic work-up as long as LV function and wall motion are normal.  Hypertension BP mildly elevated.  - Continue Lopressor  12.5mg  twice daily.  - Can resume home Lisinopril if potassium normalizes after Lokelma  (hyperkalemia likely due to supplementation).  Hyperlipidemia - Continue  Lipitor 40mg  daily.   Hypokalemia Hyperkalemia Potassium as low as 3.0 yesterday.  - Repleted and 5.7 today. She has now been given a dose of Lokelma .  - Continue to monitor.  Prolonged QTc  QTc 504 ms on initial EKG. Improved to 389 ms on repeat EKG on 01/12/2024 but this was while in atrial fibrillation which makes assessment more difficult.  - Potassium 5.7 and Magnesium  2.5 today. - Avoid QTc prolonging agents. - Will repeat EKG now that she is back in sinus rhythm.  Otherwise, per primary team: - Influenza A - Viral vs bacterial pneumonia  - Acute colitis  - T12 compression fracture  - Elevated d-dimer: CTA negative for PE - Hyponatermia - COPD - Bronchiectasis - Prediabetes - Constipation   For questions or updates, please contact [facility] Please consult [email or web address] for contact info under     Signed, Callie E Goodrich, PA-C  01/14/2024, 10:12 AM    PT seen and examined   I agree with fidngs as noted by JAYSON Door above  PT comfortable laying in bed Neck:  JVP is normal  Lungs are CTA Cardiac   RRR  No S3  No murmurs Abd supple Ext without edema   Impression   1  Atrial fibrillation.   Pt in SR now     EKG being done  Keep on lopressor  and now Eliquis   Follow HR   Follow Hgb  2  Hx HFrEF   Echo pending   3  HTN  BP is mildly increased   Would make sure that K stabilized before adding ACE I  4  QT prolongation  Repeat EKG being done   Will review/compare Avoid drugs that prolong  Vina Gull MD   "

## 2024-01-14 NOTE — TOC Initial Note (Signed)
 Transition of Care [facility]) - Initial/Assessment Note    Patient Details  Name: Heidi Spence MRN: [id number] Date of Birth: [birth year]  [facility]) CM/SW Contact:    Inocente GORMAN Kindle, LCSW Phone Number: 01/14/2024, 10:07 AM  Clinical Narrative:                 10:07 AM-CSW left voicemail for patient's daughter to discuss discharge plan.   Expected Discharge Plan: Home w Home Health Services Barriers to Discharge: Continued Medical Work up   Patient Goals and CMS Choice Patient states their goals for this hospitalization and ongoing recovery are:: Return home          Expected Discharge Plan and Services In-house Referral: Clinical Social Work Discharge Planning Services: CM Consult   Living arrangements for the past 2 months: Single Family Home                                      Prior Living Arrangements/Services Living arrangements for the past 2 months: Single Family Home Lives with:: Adult Children Patient language and need for interpreter reviewed:: Yes (Speaks Laotian) Do you feel safe going back to the place where you live?: Yes      Need for Family Participation in Patient Care: Yes (Comment) Care giver support system in place?: Yes (comment)   Criminal Activity/Legal Involvement Pertinent to Current Situation/Hospitalization: No - Comment as needed  Activities of Daily Living      Permission Sought/Granted Permission sought to share information with : Facility Medical Sales Representative, Family Supports Permission granted to share information with : Yes, Verbal Permission Granted  Share Information with NAME: MALEEA, CAMILO- Daughter   [phone]           Emotional Assessment Appearance:: Appears stated age     Orientation: : Oriented to Self, Oriented to Place, Oriented to  Time, Oriented to Situation Alcohol / Substance Use: Not Applicable Psych Involvement: No (comment)  Admission diagnosis:  Influenza A [J10.1] Compression  fracture of T12 vertebra, initial encounter (HCC) [S22.080A] Falls, initial encounter [T80.XXXA] Patient Active Problem List   Diagnosis Date Noted   Atrial fibrillation with RVR (HCC) 01/13/2024   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) (HCC) 01/13/2024   Influenza A 01/13/2024   Falls, initial encounter 01/11/2024   Exertional dyspnea 06/30/2022   Influenza B 03/08/2022   Stage 3a chronic kidney disease (HCC) 11/18/2021   Chronic systolic congestive heart failure (HCC) 03/10/2021   Bronchiectasis without complication (HCC) 10/05/2017   Essential hypertension 02/18/2013   PCP:  Godwin Shed, MD Pharmacy:   [facility] [phone] - [place], [place] - [address] AT [facility] [address] [place] [address] Phone: [phone] Fax: [phone]  [facility] [address] [place] [address] Phone: [phone] Fax: [phone]     Social Drivers of Health (SDOH) Social History: SDOH Screenings   Food Insecurity: No Food Insecurity (01/11/2024)  Housing: Unknown (01/11/2024)  Transportation Needs: No Transportation Needs (01/11/2024)  Utilities: Not At Risk (01/11/2024)  Social Connections: Unknown (01/11/2024)  Tobacco Use: Low Risk (07/23/2023)   Received from [facility]   SDOH Interventions:     Readmission Risk Interventions     No data to display

## 2024-01-14 NOTE — Progress Notes (Signed)
" °  Echocardiogram 2D Echocardiogram has been performed.  Koleen KANDICE Popper, RDCS 01/14/2024, 11:17 AM "

## 2024-01-14 NOTE — Telephone Encounter (Signed)
 Patient Product/process development scientist completed.    The patient is insured through [facility]. Patient has Medicare and is not eligible for a copay card, but may be able to apply for patient assistance or Medicare RX Payment Plan (Patient Must reach out to their plan, if eligible for payment plan), if available.    Ran test claim for Eliquis 5 mg and the current 30 day co-pay is $0.00.   This test claim was processed through [facility]- copay amounts may vary at other pharmacies due to pharmacy/plan contracts, or as the patient moves through the different stages of their insurance plan.     Reyes Sharps, CPHT Pharmacy Technician Patient Advocate Specialist Lead [facility] Patient Advocate Team Direct Number: [phone]  Fax: [phone]

## 2024-01-14 NOTE — TOC CAGE-AID Note (Signed)
 Transition of Care [facility]) - CAGE-AID Screening   Patient Details  Name: Heidi Spence MRN: [id number] Date of Birth: Jun 02, 1940  [facility]) CM/SW Contact:    Arwyn Besaw E Lio Wehrly, LCSW Phone Number: 01/14/2024, 10:56 AM   Clinical Narrative: No SA noted.   CAGE-AID Screening:    Have You Ever Felt You Ought to Cut Down on Your Drinking or Drug Use?: No Have People Annoyed You By Critizing Your Drinking Or Drug Use?: No Have You Felt Bad Or Guilty About Your Drinking Or Drug Use?: No Have You Ever Had a Drink or Used Drugs First Thing In The Morning to Steady Your Nerves or to Get Rid of a Hangover?: No CAGE-AID Score: 0  Substance Abuse Education Offered: No

## 2024-01-14 NOTE — Progress Notes (Signed)
 " PROGRESS NOTE    Heidi Spence  FMW:[id number] DOB: 07/05/1940 DOA: 01/11/2024 PCP: Godwin Shed, MD   Chief Complaint  Patient presents with   Fall    Brief Narrative:  Heidi Spence is a 83 y.o. female with past medical history  of  h/o HTN, Hyperlipidemia, Chronic Systolic CHF, SVT, Bradycardia, CKD Stage 3a, Pulmonary Nodule, Bronchiectasis, Chest Discomfort, Trigeminal Neuralgia, Osteoporosis, Hyponatremia, Vitamin D Deficiency, Allergic Rhinitis, Bilat. Hearing Loss, Bilat. Tinnitus, Insomnia, Depression brought from home for --fall on right side while walking out of bedroom. No LOC or head injury or blood thinners.  Workup significant for influenza A and colitis.    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 - Pain is controlled -History of osteoporosis, continue with Fosamax. -PT/OT consulted. -TLSO brace while sitting up and with activity. - Will start calcium  and vitamin D  Influenza A infection Viral pneumonia versus bacterial pneumonia Acute colitis -Congestion, positive influenza A, imaging significant for plugging, questionable aspiration versus acute bronchiolitis. - Was encouraged with incentive spirometry and flutter valve with evidence of mucous plugging. - Treated with IV Rocephin  and Flagyl  to cover for both colitis and pneumonia, clinically improving, will DC antibiotics continue with IV Rocephin  and Flagyl  to cover for both colitis and pneumonia - Continue with Tamiflu    New onset A-fib -Apparently patient developed A-fib with RVR overnight plan was for Cardizem  drip, but heart rate got controlled on  IV Cardizem  pushes. - Started on low-dose beta-blockers for heart rate control by cardiology - TSH within normal limit. - 2D echo in June 2024, with a preserved EF 60 to 65%, no regional wall motion abnormalities,.  -Repeat 2 echo pending - Cardiology input greatly appreciated  Elevated D-dimers -Most likely acute elevated inflammatory markers in the setting of lonesome infection. - CTA negative for PE  Hyponatremia -Volume depletion, resolved with IV fluid  Hypokalemia/hyperkalemia Hypophosphatemia - Replaced, monitor closely. - Potassium elevated today, will give Lokelma  than recheck level in p.m.  Constipation - Patient remains with significant constipation despite multiple laxatives, will give magnesium  citrate today.        DVT prophylaxis: ([place] heaptin) Code Status: (Full) Family Communication: (D/W daughter at bedside) Disposition: PT/OT pending, likely will discharge home tomorrow in a.m. on p.o. antibiotic if oral intake improves, and her labs within normal limit.    Consultants:  cardiology   Subjective:  Still reports nausea, no bowel movement yet, last BM was 7 days ago  Objective: Vitals:   01/13/24 1607 01/13/24 1800 01/13/24 1915 01/14/24 0800  BP: (!) 166/78  132/86 (!) 158/81  Pulse: 69  72 74  Resp: (!) 23  (!) 22 18  Temp: (!) 97.4 F (36.3 C) (!) 97.4 F (36.3 C) 97.6 F (36.4 C) 98.7 F (37.1 C)  TempSrc: Oral Oral Oral Axillary  SpO2: 98%  96% 93%  Weight:       No intake or output data in the 24 hours ending 01/14/24 0951  Filed Weights   01/13/24 0437 01/13/24 0755  Weight: 38.3 kg 35.1 kg    Examination:  Awake, alert, frail, no apparent distress Good air entry bilaterally Regular rate and rhythm Abdomen soft Lower  extremities with no edema  Data Reviewed: I have personally reviewed following labs and imaging studies  CBC: Recent Labs  Lab 01/11/24 1130 01/11/24 1139 01/12/24 0359 01/13/24 0544 01/14/24 0408   WBC  --  8.1 6.5 6.4 10.2  NEUTROABS  --  6.9  --   --   --   HGB 13.3 12.8 11.8* 11.9* 11.2*  HCT 39.0 37.0 33.8* 34.5* 32.6*  MCV  --  85.3 85.4 84.8 85.8  PLT  --  183 191 233 277    Basic Metabolic Panel: Recent Labs  Lab 01/11/24 1130 01/11/24 1139 01/11/24 1835 01/12/24 0359 01/13/24 0544 01/13/24 0720 01/14/24 0408  NA 135 133*  --  132* 136  --  133*  K 3.7 3.7  --  3.3* 3.0*  --  5.7*  CL 101 99  --  99 100  --  101  CO2  --  22  --  22 23  --  23  GLUCOSE 166* 165*  --  91 106*  --  96  BUN 12 12  --  14 8  --  9  CREATININE 0.80 0.78  --  0.76 0.61  --  0.74  CALCIUM   --  8.8*  --  8.1* 7.5*  --  7.9*  MG  --   --  1.8 1.9  --  1.8 2.5*  PHOS  --   --   --  2.3*  --   --  1.3*    GFR: CrCl cannot be calculated (Unknown ideal weight.).  Liver Function Tests: Recent Labs  Lab 01/11/24 1139 01/12/24 0359  AST 101* 67*  ALT 44 32  ALKPHOS 78 69  BILITOT 0.4 0.3  PROT 7.3 6.6  ALBUMIN 3.8 3.3*    CBG: No results for input(s): GLUCAP in the last 168 hours.   Recent Results (from the past 240 hours)  Resp panel by RT-PCR (RSV, Flu A&B, Covid) Anterior Nasal Swab     Status: Abnormal   Collection Time: 01/11/24 11:26 AM   Specimen: Anterior Nasal Swab  Result Value Ref Range Status   SARS Coronavirus 2 by RT PCR NEGATIVE NEGATIVE Final   Influenza A by PCR POSITIVE (A) NEGATIVE Final   Influenza B by PCR NEGATIVE NEGATIVE Final    Comment: (NOTE) The Xpert Xpress SARS-CoV-2/FLU/RSV plus assay is intended as an aid in the diagnosis of influenza from Nasopharyngeal swab specimens and should not be used as a sole basis for treatment. Nasal washings and aspirates are unacceptable for Xpert Xpress SARS-CoV-2/FLU/RSV testing.  Fact Sheet for Patients: bloggercourse.com  Fact Sheet for Healthcare Providers: seriousbroker.it  This test is not yet approved or cleared by the [place]  FDA and has  been authorized for detection and/or diagnosis of SARS-CoV-2 by FDA under an Emergency Use Authorization (EUA). This EUA will remain in effect (meaning this test can be used) for the duration of the COVID-19 declaration under Section 564(b)(1) of the Act, 21 U.S.C. section 360bbb-3(b)(1), unless the authorization is terminated or revoked.     Resp Syncytial Virus by PCR NEGATIVE NEGATIVE Final    Comment: (NOTE) Fact Sheet for Patients: bloggercourse.com  Fact Sheet for Healthcare Providers: seriousbroker.it  This test is not yet approved or cleared by the [place]  FDA and has been authorized for detection and/or diagnosis of SARS-CoV-2 by FDA under an Emergency Use Authorization (EUA). This EUA will remain in effect (meaning this test can be used) for the duration of the COVID-19  declaration under Section 564(b)(1) of the Act, 21 U.S.C. section 360bbb-3(b)(1), unless the authorization is terminated or revoked.  Performed at [facility], [address], [place], [place] [address]          Radiology Studies: No results found.       Scheduled Meds:  apixaban   2.5 mg Oral BID   atorvastatin   40 mg Oral Daily   diltiazem   10 mg Intravenous Once   magnesium  citrate  1 Bottle Oral Once   metoprolol  tartrate  12.5 mg Oral BID   oseltamivir   30 mg Oral BID   polyethylene glycol  17 g Oral Daily   sodium chloride  flush  3 mL Intravenous Q12H   Continuous Infusions:  sodium PHOSPHATE  IVPB (in mmol) 30 mmol (01/14/24 0832)     LOS: 2 days        Brayton Lye, MD [facility]   To contact the attending provider between 7A-7P or the covering provider during after hours 7P-7A, please log into the web site [email or web address] and access using universal [place] password for that web site. If you do not have the password, please call the hospital operator.  01/14/2024, 9:51 AM   "

## 2024-01-15 ENCOUNTER — Other Ambulatory Visit (HOSPITAL_COMMUNITY): Payer: Self-pay

## 2024-01-15 ENCOUNTER — Inpatient Hospital Stay (HOSPITAL_COMMUNITY)

## 2024-01-15 DIAGNOSIS — I4891 Unspecified atrial fibrillation: Secondary | ICD-10-CM | POA: Diagnosis not present

## 2024-01-15 DIAGNOSIS — I10 Essential (primary) hypertension: Secondary | ICD-10-CM | POA: Diagnosis not present

## 2024-01-15 LAB — BASIC METABOLIC PANEL WITH GFR
Anion gap: 11 (ref 5–15)
BUN: 9 mg/dL (ref 8–23)
CO2: 23 mmol/L (ref 22–32)
Calcium: 8.4 mg/dL — ABNORMAL LOW (ref 8.9–10.3)
Chloride: 97 mmol/L — ABNORMAL LOW (ref 98–111)
Creatinine, Ser: 0.73 mg/dL (ref 0.44–1.00)
GFR, Estimated: >60 mL/min
Glucose, Bld: 83 mg/dL (ref 70–99)
Potassium: 4.9 mmol/L (ref 3.5–5.1)
Sodium: 131 mmol/L — ABNORMAL LOW (ref 135–145)

## 2024-01-15 LAB — CBC
HCT: 31.8 % — ABNORMAL LOW (ref 36.0–46.0)
Hemoglobin: 10.9 g/dL — ABNORMAL LOW (ref 12.0–15.0)
MCH: 29.5 pg (ref 26.0–34.0)
MCHC: 34.3 g/dL (ref 30.0–36.0)
MCV: 86.2 fL (ref 80.0–100.0)
Platelets: 297 K/uL (ref 150–400)
RBC: 3.69 MIL/uL — ABNORMAL LOW (ref 3.87–5.11)
RDW: 13.6 % (ref 11.5–15.5)
WBC: 13.1 K/uL — ABNORMAL HIGH (ref 4.0–10.5)
nRBC: 0 % (ref 0.0–0.2)

## 2024-01-15 LAB — PHOSPHORUS: Phosphorus: 2.3 mg/dL — ABNORMAL LOW (ref 2.5–4.6)

## 2024-01-15 LAB — MAGNESIUM: Magnesium: 2.2 mg/dL (ref 1.7–2.4)

## 2024-01-15 MED ORDER — LACTULOSE 10 GM/15ML PO SOLN
30.0000 g | Freq: Two times a day (BID) | ORAL | 0 refills | Status: AC | PRN
  Filled 2024-01-15: qty 236, 3d supply, fill #0

## 2024-01-15 MED ORDER — AMLODIPINE BESYLATE 5 MG PO TABS
2.5000 mg | ORAL_TABLET | Freq: Every day | ORAL | Status: DC
  Administered 2024-01-15: 2.5 mg via ORAL
  Filled 2024-01-15: qty 1

## 2024-01-15 MED ORDER — METOPROLOL TARTRATE 25 MG PO TABS
25.0000 mg | ORAL_TABLET | Freq: Two times a day (BID) | ORAL | 0 refills | Status: AC
  Filled 2024-01-15: qty 60, 30d supply, fill #0

## 2024-01-15 MED ORDER — LACTULOSE 10 GM/15ML PO SOLN: 30.0000 g | Freq: Two times a day (BID) | ORAL | Status: DC | PRN

## 2024-01-15 MED ORDER — MAGIC MOUTHWASH
5.0000 mL | Freq: Four times a day (QID) | ORAL | 0 refills | Status: DC
  Filled 2024-01-15: qty 100, 5d supply, fill #0

## 2024-01-15 MED ORDER — OSELTAMIVIR PHOSPHATE 30 MG PO CAPS
30.0000 mg | ORAL_CAPSULE | Freq: Two times a day (BID) | ORAL | 0 refills | Status: AC
  Filled 2024-01-15 – 2024-01-16 (×2): qty 2, 1d supply, fill #0

## 2024-01-15 MED ORDER — FUROSEMIDE 10 MG/ML IJ SOLN
20.0000 mg | Freq: Once | INTRAMUSCULAR | Status: AC
  Administered 2024-01-15: 20 mg via INTRAVENOUS
  Filled 2024-01-15: qty 2

## 2024-01-15 MED ORDER — AMLODIPINE BESYLATE 2.5 MG PO TABS
2.5000 mg | ORAL_TABLET | Freq: Every day | ORAL | 0 refills | Status: AC
  Filled 2024-01-15: qty 30, 30d supply, fill #0

## 2024-01-15 MED ORDER — POTASSIUM & SODIUM PHOSPHATES 280-160-250 MG PO PACK
1.0000 | PACK | Freq: Three times a day (TID) | ORAL | Status: DC
  Filled 2024-01-15 (×3): qty 1

## 2024-01-15 MED ORDER — ACETAMINOPHEN 325 MG PO TABS: 650.0000 mg | ORAL_TABLET | Freq: Four times a day (QID) | ORAL | Status: AC | PRN

## 2024-01-15 MED ORDER — POTASSIUM & SODIUM PHOSPHATES 280-160-250 MG PO PACK
1.0000 | PACK | Freq: Two times a day (BID) | ORAL | 0 refills | Status: AC
  Filled 2024-01-15: qty 4, 2d supply, fill #0

## 2024-01-15 MED ORDER — IPRATROPIUM-ALBUTEROL 0.5-2.5 (3) MG/3ML IN SOLN
3.0000 mL | Freq: Four times a day (QID) | RESPIRATORY_TRACT | 0 refills | Status: AC | PRN
  Filled 2024-01-15: qty 360, 30d supply, fill #0

## 2024-01-15 MED ORDER — NYSTATIN 100000 UNIT/ML MT SUSP
5.0000 mL | Freq: Four times a day (QID) | OROMUCOSAL | 0 refills | Status: AC
  Filled 2024-01-15: qty 60, 3d supply, fill #0

## 2024-01-15 MED ORDER — HYDROCODONE-ACETAMINOPHEN 5-325 MG PO TABS
1.0000 | ORAL_TABLET | Freq: Four times a day (QID) | ORAL | 0 refills | Status: AC | PRN
  Filled 2024-01-15: qty 10, 3d supply, fill #0

## 2024-01-15 MED ORDER — METOPROLOL TARTRATE 25 MG PO TABS: 25.0000 mg | ORAL_TABLET | Freq: Two times a day (BID) | ORAL | Status: DC

## 2024-01-15 MED ORDER — APIXABAN 2.5 MG PO TABS
2.5000 mg | ORAL_TABLET | Freq: Two times a day (BID) | ORAL | 0 refills | Status: AC
  Filled 2024-01-15: qty 60, 30d supply, fill #0

## 2024-01-15 MED ORDER — SODIUM CHLORIDE 0.9 % IV SOLN
2.0000 g | INTRAVENOUS | Status: DC
  Administered 2024-01-15: 2 g via INTRAVENOUS
  Filled 2024-01-15: qty 20

## 2024-01-15 MED ORDER — OYSTER SHELL CALCIUM/D3 500-5 MG-MCG PO TABS
1.0000 | ORAL_TABLET | Freq: Two times a day (BID) | ORAL | 0 refills | Status: AC
  Filled 2024-01-15: qty 60, 30d supply, fill #0

## 2024-01-15 NOTE — Progress Notes (Signed)
 DISCHARGE NOTE HOME Heidi Spence to be discharged Home per MD order. Discussed prescriptions and follow up appointments with the patient. Prescriptions given to patient; medication list explained in detail. Patient verbalized understanding.  Skin clean, dry and intact without evidence of skin break down, no evidence of skin tears noted. IV catheter discontinued intact. Site without signs and symptoms of complications. Dressing and pressure applied. Pt denies pain at the site currently. No complaints noted.  Patient free of lines, drains, and wounds.   An After Visit Summary (AVS) was printed and given to the patient. Reviewed with Daughter  [name], RN

## 2024-01-15 NOTE — TOC Transition Note (Signed)
 Transition of Care [facility]) - Discharge Note   Patient Details  Name: Heidi Spence MRN: [id number] Date of Birth: 13-Aug-1940  Transition of Care [facility]) CM/SW Contact:  Landry DELENA Senters, RN Phone Number: 01/15/2024, 12:07 PM   Clinical Narrative:    Patient will be discharging home today, with family providing transportation.  Therapy rec. For SNF. Patient and family refused this. HHPT arranged through [facility], info on AVS. Patient has adequate support at home, 24/7 care from family. Family and patient do request not to be seen by [facility] agency until next week. HH agency is aware of this.   No DME needs identified for home.   No further needs identified by CM.   Final next level of care: Home w Home Health Services Barriers to Discharge: No Barriers Identified   Patient Goals and CMS Choice Patient states their goals for this hospitalization and ongoing recovery are:: Return home CMS Medicare.gov Compare Post Acute Care list provided to:: Patient Choice offered to / list presented to : Patient      Discharge Placement                       Discharge Plan and Services Additional resources added to the After Visit Summary for   In-house Referral: Clinical Social Work Discharge Planning Services: CM Consult                      HH Arranged: PT [facility] Agency: [facility] Date [facility] Agency Contacted: 01/15/24 Time HH Agency Contacted: 1207 Representative spoke with at [facility] Agency: [place]  Social Drivers of Health (SDOH) Interventions SDOH Screenings   Food Insecurity: No Food Insecurity (01/14/2024)  Housing: Low Risk (01/14/2024)  Transportation Needs: No Transportation Needs (01/14/2024)  Utilities: Not At Risk (01/14/2024)  Social Connections: Moderately Integrated (01/14/2024)  Tobacco Use: Unknown (01/14/2024)     Readmission Risk Interventions     No data to display

## 2024-01-15 NOTE — Discharge Instructions (Addendum)

## 2024-01-15 NOTE — Progress Notes (Signed)
 Physical Therapy Treatment Patient Details Name: Heidi Spence MRN: [id number] DOB: 03-03-1940 Today's Date: 01/15/2024   History of Present Illness Pt is an 83 y.o female presenting to [facility] on 12/[facility] following fall, reporting R hip and LBP.  X-ray shows T12 compression fx. Pt to wear TLSO seated and standing.  PMH: HTN, HLD, CHF, SVT, bradycardia, CKD, pulmonary nodule, bronchiectasis, trigeminal neuralgia, osteoporosis, hearing loss, insomnia, depression.    PT Comments  Session focused on TLSO check and review of donning/doffing for home management. Review of education provided regarding when TLSO being worn with upright activity and how to adjust the brace if needed. Daughter primarily helps pt don/doff the device EOB. Brace donned in standing with brief cues for appropriate technique. Brace continues to show good fit and family expresses understanding of education. Pt is CGA for bed mobility and minA for STS via hand held assist for balance. Pt becomes slightly SOB in standing and requests to return to sitting. TLSO is removed prior to return to bed. Pt anticipated to discharge today. Will continue to follow up if discharge does not occur to promote strength and activity tolerance with TLSO.    If plan is discharge home, recommend the following: A little help with walking and/or transfers;A lot of help with bathing/dressing/bathroom;Assistance with cooking/housework;Assist for transportation;Help with stairs or ramp for entrance   Can travel by private vehicle     Yes (with TLSO due to T12 compression fx)  Equipment Recommendations  Rolling walker (2 wheels);BSC/3in1    Recommendations for Other Services       Precautions / Restrictions Precautions Precautions: Fall;Other (comment) (Spinal) Recall of Precautions/Restrictions: Impaired Required Braces or Orthoses: Spinal Brace Spinal Brace: Thoracolumbosacral orthotic;Applied in sitting position;Applied in standing  position Restrictions Weight Bearing Restrictions Per Provider Order: No     Mobility  Bed Mobility Overal bed mobility: Modified Independent Bed Mobility: Supine to Sit, Sit to Supine     Supine to sit: Contact guard Sit to supine: Contact guard assist   General bed mobility comments: Additional time required to complete transfer. Pt sits up in bed prior to bringing feet over EOB. Does not demonstrate carryover of log roll technique. No sign of increased pain with bed mobility.    Transfers Overall transfer level: Needs assistance Equipment used: 1 person hand held assist Transfers: Sit to/from Stand Sit to Stand: Min assist           General transfer comment: Pt benefits from HHA in standing for balance. Pt fatigues quickly and becomes SOB in standing. Declines further mobility at this time. TLSO donned in standing.    Ambulation/Gait                   Stairs             Wheelchair Mobility     Tilt Bed    Modified Rankin (Stroke Patients Only)       Balance Overall balance assessment: Mild deficits observed, not formally tested                                          Communication Communication Communication: Impaired Factors Affecting Communication: Non - English speaking, interpreter not available (Daughter present and provides interpretation)  Cognition Arousal: Alert Behavior During Therapy: WFL for tasks assessed/performed   PT - Cognitive impairments: No apparent impairments  Following commands: Intact      Cueing Cueing Techniques: Verbal cues, Tactile cues, Visual cues  Exercises      General Comments General comments (skin integrity, edema, etc.): VSS throughout, no new skin abnormalities noted. No redness noted from TLSO fit.      Pertinent Vitals/Pain Pain Assessment Pain Assessment: No/denies pain    Home Living                          Prior Function             PT Goals (current goals can now be found in the care plan section) Acute Rehab PT Goals Patient Stated Goal: No new goals stated during session Progress towards PT goals: Progressing toward goals    Frequency    Min 2X/week      PT Plan      Co-evaluation              AM-PAC PT 6 Clicks Mobility   Outcome Measure  Help needed turning from your back to your side while in a flat bed without using bedrails?: None Help needed moving from lying on your back to sitting on the side of a flat bed without using bedrails?: None Help needed moving to and from a bed to a chair (including a wheelchair)?: A Little Help needed standing up from a chair using your arms (e.g., wheelchair or bedside chair)?: A Little Help needed to walk in hospital room?: A Lot Help needed climbing 3-5 steps with a railing? : A Lot 6 Click Score: 18    End of Session Equipment Utilized During Treatment: Back brace Activity Tolerance: Patient limited by fatigue Patient left: in bed;with call bell/phone within reach;with nursing/sitter in room;with family/visitor present Nurse Communication: Mobility status PT Visit Diagnosis: Unsteadiness on feet (R26.81);Muscle weakness (generalized) (M62.81)     Time: 1210-1222 PT Time Calculation (min) (ACUTE ONLY): 12 min  Charges:    $Orthotics/Prosthetics Check: 8-22 mins PT General Charges $$ ACUTE PT VISIT: 1 Visit                     Sabra Morel, PT, DPT  Acute Rehabilitation Services         Office: [phone]      Sabra MARLA Morel 01/15/2024, 12:37 PM

## 2024-01-15 NOTE — TOC Progression Note (Signed)
 Transition of Care [facility]) - Progression Note    Patient Details  Name: Heidi Spence MRN: [id number] Date of Birth: Mar 04, 1940  [facility] CM/SW Contact  Landry DELENA Senters, RN Phone Number: 01/15/2024, 9:54 AM  Clinical Narrative:     CM spoke with patient's daughter in the room. Patient will be going home with family staying 24/7.   CM discussed HH with patient and family, and they are agreeable to doing this. [facility] referral sent in hub, currently waiting for response.   CM reviewed home DME and no needs identified.   CM will continue to follow.   Expected Discharge Plan: Home w Home Health Services Barriers to Discharge: Continued Medical Work up               Expected Discharge Plan and Services In-house Referral: Clinical Social Work Discharge Planning Services: CM Consult   Living arrangements for the past 2 months: Single Family Home                                       Social Drivers of Health (SDOH) Interventions SDOH Screenings   Food Insecurity: No Food Insecurity (01/14/2024)  Housing: Low Risk (01/14/2024)  Transportation Needs: No Transportation Needs (01/14/2024)  Utilities: Not At Risk (01/14/2024)  Social Connections: Moderately Integrated (01/14/2024)  Tobacco Use: Unknown (01/14/2024)    Readmission Risk Interventions     No data to display

## 2024-01-15 NOTE — Discharge Summary (Addendum)
 Physician Discharge Summary  Amyrah Pinkhasov FMW:[id number] DOB: Apr 18, 1940 DOA: 01/11/2024  PCP: Godwin Shed, MD  Admit date: 01/11/2024 Discharge date: 01/15/2024  Admitted From: (Home) Disposition:  (Home )  Recommendations for Outpatient Follow-up:  Follow up with PCP in 1-2 weeks Please obtain BMP/CBC in one week Obtain repeat CT chest in 81-month to follow-up on new findings of lung nodules, please see discussion below   Diet recommendation: Regular diet Brief/Interim Summary:  Leelah Hanna is a 83 y.o. female with past medical history  of  h/o HTN, Hyperlipidemia, Chronic Systolic CHF, SVT, Bradycardia, CKD Stage 3a, Pulmonary Nodule, Bronchiectasis, Chest Discomfort, Trigeminal Neuralgia, Osteoporosis, Hyponatremia, Vitamin D Deficiency, Allergic Rhinitis, Bilat. Hearing Loss, Bilat. Tinnitus, Insomnia, Depression brought from home for --fall on right side while walking out of bedroom. No LOC or head injury or blood thinners.  Workup significant for influenza A and colitis.  As well she developed A-fib with RVR during hospital stay.  Fall T12 compression fracture Osteoporosis -Fall secondary to weakness, deconditioning in the setting of acute illness from influenza A infection, pneumonia and colitis-continue with PT/OT.  To be arranged at home - Pain is controlled, will dispense 10 tablets of Vicodin -History of osteoporosis, continue with Fosamax.  Started on calcium  and vitamin D as well -TLSO brace while sitting up and with activity.    Influenza A infection Viral pneumonia versus bacterial pneumonia Acute colitis -Congestion, positive influenza A, imaging significant for plugging, questionable aspiration versus acute bronchiolitis. - Was encouraged with incentive spirometry and flutter valve with evidence of mucous plugging.  There is no oxygen requirement during hospital stay at time of discharge - Treated with IV Rocephin  and Flagyl  to cover for both colitis and pneumonia,  clinically improving, no further need for antibiotics on discharge  - Continue with Tamiflu  finish total of 5 days   Oral thrush -Will discharge on nystatin Magic mouthwash  New onset A-fib -Apparently patient developed A-fib with RVR overnight plan was for Cardizem  drip, but heart rate got controlled on IV Cardizem  pushes. - Started on low-dose beta-blockers for heart rate control by cardiology, will discharge 25 mg p.o. twice daily -On Eliquis  for anticoagulation, she is on 2.5 mg p.o. twice daily reduced dose given age and weight. - TSH within normal limit. - Cardiology input greatly appreciated   Elevated D-dimers -Most likely acute elevated inflammatory markers in the setting of lonesome infection. - CTA negative for PE   Hyponatremia -Volume depletion, resolved with IV fluid  New 7 mm irregular right upper lobe nodule and 6 mm left upper lobe ground - Radiology recommend recommend non-contrast chest CT at 3-6 months, then repeat non-contrast chest CT at 18-24 months, as per Fleischner Society Guidelines.   Hypokalemia/hyperkalemia Hypophosphatemia - Replaced, monitor closely. - Potassium was elevated due to supplements, received Lokelma  yesterday, potassium within normal range at time of discharge,  - No ACE or ARB given her hyperkalemia.   Chronic HFrEF with Improved EF EF as low as 40-45% in the past but has subsequently improved. Last Echo in 06/2022 showed LVEF of 60-65%   Echo yesterday shows LVEF and RVEF are normal   Mod TR with severely elevated estimated PAP (70mm)   Mod MR   IVC dilated     - 1 dose of IV Lasix  prior to discharge, continue with home dose Lasix  20 mg p.o. daily on discharge.  Hypertension -Will discharge on metoprolol  25 mg p.o. twice daily, and low-dose amlodipine  2.5 mg oral daily, both were started  during hospital stay, no ACE or ARB at time of discharge given hyperkalemia.  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        Medication List     STOP taking these medications    aspirin  EC 81 MG tablet   azithromycin 250 MG tablet Commonly known as: ZITHROMAX   lisinopril 40 MG tablet Commonly known as: ZESTRIL       TAKE these medications    acetaminophen  325 MG tablet Commonly known as: TYLENOL  Take 2 tablets (650 mg total) by mouth every 6 (six) hours as needed for mild pain (pain score 1-3), moderate pain (pain score 4-6), fever or headache (or Fever >/= 101).   alendronate 70 MG tablet Commonly known as: FOSAMAX Take 70 mg by mouth once a week.   amLODipine  2.5 MG tablet Commonly known as: NORVASC  Take 1 tablet (2.5 mg total) by mouth daily. Start taking on: January 16, 2024   apixaban  2.5 MG Tabs tablet Commonly known as: ELIQUIS  Take 1 tablet (2.5 mg total) by mouth 2 (two) times daily.   atorvastatin  40 MG tablet Commonly known as: LIPITOR Take 40 mg by mouth daily.   calcium -vitamin D 500-5 MG-MCG tablet Commonly known as: OSCAL WITH D Take 1 tablet by mouth 2 (two) times daily.   Cranberry 200 MG Caps Take 200 mg by mouth daily.   dextromethorphan-guaiFENesin 30-600 MG 12hr tablet Commonly known as: MUCINEX DM Take 1 tablet by mouth daily.   furosemide  20 MG tablet Commonly known as: LASIX  Take 20 mg by mouth daily.   gabapentin 300 MG capsule Commonly known as: NEURONTIN Take 300 mg by mouth daily.   HYDROcodone -acetaminophen  5-325 MG tablet Commonly known as: NORCO/VICODIN Take 1  tablet by mouth every 6 (six) hours as needed for severe pain (pain score 7-10).   ipratropium-albuterol  0.5-2.5 (3) MG/3ML Soln Commonly known as: DUONEB Take 3 mLs by nebulization every 6 (six) hours as needed (shortness of breath).   lactulose 10 GM/15ML solution Commonly known as: CHRONULAC Take 45 mLs (30 g total) by mouth 2 (two) times daily as needed for mild constipation.   loratadine 10 MG tablet Commonly known as: CLARITIN Take 10 mg by mouth daily as needed for allergies or rhinitis.   metoprolol  tartrate 25 MG tablet Commonly known as: LOPRESSOR  Take 1 tablet (25 mg total) by mouth 2 (two) times daily.   multivitamin with minerals Tabs tablet Take 1 tablet by mouth daily.   nystatin 100000 UNIT/ML suspension Commonly known as: MYCOSTATIN Take 5 mLs (500,000 Units total) by mouth 4 (four) times daily for 3 days.   oseltamivir  30 MG capsule Commonly known as: TAMIFLU  Take 1 capsule (30 mg total) by mouth 2 (two) times daily.   potassium & sodium phosphates  280-160-250 MG Pack Commonly known as: PHOS-NAK Take 1 packet by mouth 2 (two) times daily for 2 days.   Soothe  Nighttime Oint Place 1 Application into both eyes at bedtime.        Allergies[1]  Consultations: Cardiology   Procedures/Studies: DG Chest Port 1 View Result Date: 01/15/2024 EXAM: 1 VIEW(S) XRAY OF THE CHEST 01/15/2024 09:31:40 AM COMPARISON: 01/11/2024 CLINICAL HISTORY: Cough FINDINGS: LUNGS AND PLEURA: Hyperinflation of lungs. Known left upper lobe pulmonary nodule. Chronic coarsened interstitial markings without pulmonary edema. Small right pleural effusion. No pneumothorax. HEART AND MEDIASTINUM: Aortic calcification. No acute abnormality of the cardiac and mediastinal silhouettes. BONES AND SOFT TISSUES: No acute osseous abnormality. IMPRESSION: 1. No focal consolidation or pulmonary edema. 2. Small right pleural effusion. 3. Hyperinflated lungs with chronic coarsened interstitial  markings. 4. Known left upper lobe pulmonary nodule. 5. Aortic atherosclerosis. Electronically signed by: Evalene Coho MD 01/15/2024 11:05 AM EST RP Workstation: [id number]   ECHOCARDIOGRAM COMPLETE Result Date: 01/14/2024    ECHOCARDIOGRAM REPORT   Patient Name:   LAKRISTA SCADUTO Date of Exam: 01/14/2024 Medical Rec #:  [id number]   Height:       60.0 in Accession #:    [id number]  Weight:       77.3 lb Date of Birth:  02-03-1940   BSA:          1.246 m Patient Age:    83 years    BP:           158/81 mmHg Patient Gender: F           HR:           74 bpm. Exam Location:  Inpatient Procedure: 2D Echo, 3D Echo, Cardiac Doppler, Color Doppler and Strain Analysis            (Both Spectral and Color Flow Doppler were utilized during            procedure). Indications:    Atrial Fibrillation I48.91  History:        Patient has prior history of Echocardiogram examinations, most                 recent 07/01/2022. HFimpEF, Arrythmias:Atrial Fibrillation;                 Signs/Symptoms:Dyspnea and Shortness of Breath.  Sonographer:    Koleen Popper RDCS Referring Phys: 32 Marquis Diles GORMAN LYE  Sonographer Comments: Image acquisition challenging due to patient body habitus. Global longitudinal strain was attempted. IMPRESSIONS  1. Left ventricular ejection fraction, by estimation, is 60 to 65%. Left ventricular ejection fraction by 3D volume is 60 %. The left ventricle has normal function. The left ventricle has no regional wall motion abnormalities. There is mild concentric left ventricular hypertrophy. Left ventricular diastolic parameters are consistent with Grade I diastolic dysfunction (impaired relaxation).  2. Right ventricular systolic function is normal. The right ventricular size is normal. There is severely elevated pulmonary artery systolic pressure. The estimated right ventricular systolic pressure is 73.1 mmHg.  3. Left atrial size was severely dilated.  4. The mitral valve is normal in structure. Mild to  moderate mitral valve regurgitation. No evidence of mitral stenosis.  5. Tricuspid valve regurgitation is moderate.  6. The aortic valve is tricuspid. Aortic valve regurgitation is not visualized. Aortic valve sclerosis is present, with no evidence of aortic valve stenosis.  7. The inferior vena cava is dilated in size with <50% respiratory variability, suggesting right atrial pressure of 15 mmHg. FINDINGS  Left Ventricle: Left ventricular ejection fraction, by estimation, is 60 to 65%. Left ventricular ejection fraction by 3D volume  is 60 %. The left ventricle has normal function. The left ventricle has no regional wall motion abnormalities. The left ventricular internal cavity size was normal in size. There is mild concentric left ventricular hypertrophy. Left ventricular diastolic parameters are consistent with Grade I diastolic dysfunction (impaired relaxation). Right Ventricle: The right ventricular size is normal. No increase in right ventricular wall thickness. Right ventricular systolic function is normal. There is severely elevated pulmonary artery systolic pressure. The tricuspid regurgitant velocity is 3.81 m/s, and with an assumed right atrial pressure of 15 mmHg, the estimated right ventricular systolic pressure is 73.1 mmHg. Left Atrium: Left atrial size was severely dilated. Right Atrium: Right atrial size was normal in size. Pericardium: There is no evidence of pericardial effusion. Mitral Valve: The mitral valve is normal in structure. Mild to moderate mitral valve regurgitation. No evidence of mitral valve stenosis. MV peak gradient, 2.3 mmHg. The mean mitral valve gradient is 1.0 mmHg. Tricuspid Valve: The tricuspid valve is normal in structure. Tricuspid valve regurgitation is moderate . No evidence of tricuspid stenosis. Aortic Valve: The aortic valve is tricuspid. Aortic valve regurgitation is not visualized. Aortic valve sclerosis is present, with no evidence of aortic valve stenosis. Pulmonic  Valve: The pulmonic valve was normal in structure. Pulmonic valve regurgitation is not visualized. No evidence of pulmonic stenosis. Aorta: The aortic root is normal in size and structure. Venous: The inferior vena cava is dilated in size with less than 50% respiratory variability, suggesting right atrial pressure of 15 mmHg. IAS/Shunts: No atrial level shunt detected by color flow Doppler. Additional Comments: 3D was performed not requiring image post processing on an independent workstation and was normal.  LEFT VENTRICLE PLAX 2D LVIDd:         4.20 cm         Diastology LVIDs:         2.40 cm         LV e' medial:    6.81 cm/s LV PW:         0.90 cm         LV E/e' medial:  10.6 LV IVS:        0.90 cm         LV e' lateral:   8.43 cm/s LVOT diam:     1.70 cm         LV E/e' lateral: 8.5 LV SV:         39 LV SV Index:   31 LVOT Area:     2.27 cm        3D Volume EF LV IVRT:       102 msec        LV 3D EF:    Left                                             ventricul                                             ar LV Volumes (MOD)                            ejection LV vol d, MOD    66.6 ml  fraction A2C:                                        by 3D LV vol s, MOD    23.4 ml                    volume is A2C:                                        60 %. LV SV MOD A2C:   43.2 ml                                 3D Volume EF:                                3D EF:        60 %                                LV EDV:       93 ml                                LV ESV:       37 ml                                LV SV:        56 ml RIGHT VENTRICLE             IVC RV S prime:     10.80 cm/s  IVC diam: 2.21 cm TAPSE (M-mode): 1.9 cm LEFT ATRIUM             Index        RIGHT ATRIUM           Index LA diam:        4.33 cm 3.47 cm/m   RA Area:     15.30 cm LA Vol (A2C):   53.0 ml 42.53 ml/m  RA Volume:   37.90 ml  30.41 ml/m LA Vol (A4C):   72.3 ml 58.02 ml/m LA Biplane Vol: 64.2 ml 51.52 ml/m  AORTIC VALVE  LVOT Vmax:   80.50 cm/s LVOT Vmean:  51.500 cm/s LVOT VTI:    0.171 m  AORTA Ao Root diam: 2.50 cm Ao Asc diam:  3.42 cm MITRAL VALVE               TRICUSPID VALVE MV Area (PHT): 3.42 cm    TR Peak grad:   58.1 mmHg MV Area VTI:   1.99 cm    TR Vmax:        381.00 cm/s MV Peak grad:  2.3 mmHg MV Mean grad:  1.0 mmHg    SHUNTS MV Vmax:       0.76 m/s    Systemic VTI:  0.17 m MV Vmean:      49.8 cm/s   Systemic Diam: 1.70 cm MV Decel Time: 222 msec MR Peak grad: 205.1 mmHg MR Mean  grad: 1.2 mmHg MR Vmax:      716.00 cm/s MR Vmean:     528.0 cm/s MV E velocity: 71.90 cm/s MV A velocity: 47.90 cm/s MV E/A ratio:  1.50 Morene Brownie Electronically signed by Morene Brownie Signature Date/Time: 01/14/2024/5:52:14 PM    Final    DG Abd Portable 1V Result Date: 01/14/2024 CLINICAL DATA:  Constipation.  Nausea. EXAM: DG ABD PORTABLE 1V COMPARISON:  CT 01/11/2024 FINDINGS: Small volume of formed stool within the descending and sigmoid colon. Air within slightly dilated ascending and transverse colon. No small bowel dilatation or evidence of obstruction. Vascular calcifications are seen. IMPRESSION: Small volume of formed stool in the descending and sigmoid colon. Air within slightly dilated ascending and transverse colon. No evidence of obstruction. Electronically Signed   By: Andrea Gasman M.D.   On: 01/14/2024 14:50   CT Angio Chest PE W and/or Wo Contrast Result Date: 01/11/2024 EXAM: CTA CHEST 01/11/2024 10:47:21 PM TECHNIQUE: CTA of the chest was performed after the administration of intravenous contrast. Multiplanar reformatted images are provided for review. MIP images are provided for review. Automated exposure control, iterative reconstruction, and/or weight based adjustment of the mA/kV was utilized to reduce the radiation dose to as low as reasonably achievable. COMPARISON: Portable chest earlier today, CTA chest 05/07/2023, chest CT without contrast 06/22/2022. CLINICAL HISTORY: Cough and congestion  with a suspected pulmonary embolism. FINDINGS: PULMONARY ARTERIES: Pulmonary arteries are upper limits of normal in caliber with no evidence of thromboemboli. Mild dilatation in the superior pulmonary veins is again noted. MEDIASTINUM: There is mild to moderate cardiomegaly with a biatrial chamber predominance, particularly the left atrium. There is a prominent left atrial appendage. No pericardial effusion. There are scattered 3-vessel coronary calcifications. Mild to moderate patchy atherosclerosis of the aorta, minimal calcifications in the great vessels. There is no aneurysm, stenosis, or dissection. There is a small hiatal hernia. Mild thickening of the thoracic esophagus consistent with esophagitis without masslike thickening. There are borderline prominent pretracheal and precarinal lymph nodes, but no enlarged thoracic lymph nodes. LYMPH NODES: Borderline prominent pretracheal and precarinal lymph nodes. No enlarged thoracic lymph nodes. No axillary lymphadenopathy. LUNGS AND PLEURA: There are mild features of centrilobular emphysema. Biapical and posterior upper lobe pleuroparenchymal scarring. There is a densely calcified granuloma in the superior segment of the left lower lobe. There is diffuse bronchial thickening especially in the right middle and lower lobes. There is fluid in the distal trachea, both main bronchi and left lower lobe bronchus. There are segmental and subsegmental bronchial impactions in both lower lobes, on the left associated with infrahilar cylindrical bronchiectasis suggesting a chronic inflammatory process including chronic aspiration. Additional cylindrical bronchiectasis and scattered mucous plugging noted laterally in the right lower lobe, posteriorly in the right middle lobe. There is a trace left pleural effusion. Coarse linear scarlike markings in both bases chronically. Mild patchy alveolar infiltrates noted laterally in the superior segment of the left lower lobe without  other focal pneumonic infiltrates. There is a new 7 mm irregular right upper lobe nodule posteriorly on series 6, axial 86. A 6 mm ground glass nodule laterally in the left upper lobe on axial 59. Possibly these may be inflammatory nodules, but follow-up imaging will be needed as well as to ensure clearing of the left lower lobe infiltrate. No pneumothorax. UPPER ABDOMEN: Limited images of the upper abdomen are unremarkable. SOFT TISSUES AND BONES: There is osteopenia, kyphosis, and degenerative change of the thoracic spine. There is a new, subacute  but not chronic-appearing upper plate anterior wedge compression fracture of the T12 vertebral body, with anterior cortical buckling without retropulsion and a transverse fracture component extending from anterior to posterior within the vertebral body. Loss of vertebral height about 10 to 15 percent anteriorly and 5 to 10 percent posteriorly. There is no retropulsion. No other significant osseous findings. No acute soft tissue abnormality. IMPRESSION: 1. No evidence of pulmonary embolism. Borderline prominent pulmonary arteries. . 2. Left lower lobe mild patchy infiltrate with bronchial thickening, mucus plugging, and bronchiectasis, concerning for aspiration/infectious bronchiolitis. 3. New 7 mm irregular right upper lobe nodule and 6 mm left upper lobe ground-glass nodule; recommend non-contrast chest CT at 3-6 months, then repeat non-contrast chest CT at 18-24 months, as per Fleischner Society Guidelines. 4. Mild to moderate cardiomegaly with biatrial chamber predominance. No pericardial effusion. 5. New subacute T12 vertebral body compression fracture. No retropulsion. 6. Aortic and coronary artery atherosclerosis. Electronically signed by: Francis Quam MD 01/11/2024 11:47 PM EST RP Workstation: [id number]   CT ABDOMEN PELVIS W CONTRAST Result Date: 01/11/2024 EXAM: CT ABDOMEN AND PELVIS WITH CONTRAST 01/11/2024 01:42:59 PM TECHNIQUE: CT of the abdomen and  pelvis was performed with the administration of 60 mL of iohexol  (OMNIPAQUE ) 350 MG/ML injection. Multiplanar reformatted images are provided for review. Automated exposure control, iterative reconstruction, and/or weight-based adjustment of the mA/kV was utilized to reduce the radiation dose to as low as reasonably achievable. COMPARISON: None available. CLINICAL HISTORY: Abdominal pain, acute, nonlocalized. FINDINGS: LOWER CHEST: Diffuse bronchial wall thickening in the lung bases with mucous plugging and centrilobular and tree in bud nodularity scattered throughout the visualized lungs. Mild cardiomegaly with multivessel coronary atherosclerosis. LIVER: The liver is unremarkable. GALLBLADDER AND BILE DUCTS: Gallbladder is unremarkable. No biliary ductal dilatation. SPLEEN: No acute abnormality. PANCREAS: No acute abnormality. ADRENAL GLANDS: No acute abnormality. KIDNEYS, URETERS AND BLADDER: Multifocal cortical scarring in the kidneys bilaterally. No stones in the kidneys or ureters. No hydronephrosis. No perinephric or periureteral stranding. Urinary bladder is unremarkable. GI AND BOWEL: Total colonic diverticulosis. Subtle wall thickening and inflammatory stranding along the proximal ascending colon. Stomach demonstrates no acute abnormality. There is no bowel obstruction. PERITONEUM AND RETROPERITONEUM: No ascites. No free air. No free pelvic fluid. VASCULATURE: Diffuse aortoiliac atherosclerosis. Aorta is normal in caliber. LYMPH NODES: No lymphadenopathy. REPRODUCTIVE ORGANS: Age related atrophy of the uterus and ovaries. BONES AND SOFT TISSUES: Osteopenia. Mild compression fracture of T12. No retropulsion. No focal soft tissue abnormality. IMPRESSION: 1. Mild compression fracture of T12, favored to be acute. No retropulsion. 2. Subtle wall thickening and inflammatory stranding along the proximal ascending colon, which may reflect changes of acute diverticulitis versus an infectious or inflammatory  colitis. 3. Similar findings in the lung bases, likely reflecting changes of chronic atypical infection, such as MAI. Electronically signed by: Rogelia Myers MD 01/11/2024 02:24 PM EST RP Workstation: [id number]   DG Femur Portable 1 View Right Result Date: 01/11/2024 CLINICAL DATA:  Fall. EXAM: RIGHT FEMUR PORTABLE 1 VIEW COMPARISON:  None Available. FINDINGS: One-view portable exam of the right femur shows no evidence for an acute fracture. Mild degenerative changes are noted at the knee. No worrisome lytic or sclerotic osseous abnormality. IMPRESSION: 1. No acute bony findings on this single one-view portable exam. 2. Mild degenerative changes at the knee. Electronically Signed   By: Camellia Candle M.D.   On: 01/11/2024 12:23   DG Chest Portable 1 View Result Date: 01/11/2024 EXAM: 1 VIEW(S) XRAY OF THE CHEST 01/11/2024 11:48:00 AM  COMPARISON: 05/06/2023 CLINICAL HISTORY: 83 year old female with cough. FINDINGS: LUNGS AND PLEURA: Hyperinflation of lungs. Chronic coarsened interstitial markings without pulmonary edema. Similar-appearing upper lobe nodules. Chronic calcified granuloma in the superior segment of the left lower lobe. Coarse bilateral pulmonary interstitial opacities appears chronic and stable. No pleural effusion. No pneumothorax. HEART AND MEDIASTINUM: Aortic calcification. No acute abnormality of the cardiac and mediastinal silhouettes. BONES AND SOFT TISSUES: No acute osseous abnormality. IMPRESSION: 1. COPD.  No acute cardiopulmonary abnormality. Electronically signed by: Helayne Hurst MD 01/11/2024 12:22 PM EST RP Workstation: [id number]   DG Pelvis Portable Result Date: 01/11/2024 CLINICAL DATA:  Fall. EXAM: PORTABLE PELVIS 1-2 VIEWS COMPARISON:  None Available. FINDINGS: Bones are demineralized. No evidence for superior or inferior pubic ramus fracture. No femoral head dislocation. No evidence for femoral neck fracture on this one view study with limited assessment of the right  femoral neck due to positioning. IMPRESSION: 1. No acute bony findings. 2. Limited assessment of the right femoral neck due to positioning. Electronically Signed   By: Camellia Candle M.D.   On: 01/11/2024 12:21   (Echo, Carotid, EGD, Colonoscopy, ERCP)    Subjective:  Daughter in the room, patient appears comfortable, no significant events overnight  Discharge Exam: Vitals:   01/15/24 0400 01/15/24 0820  BP: (!) 159/74 (!) 154/78  Pulse: 83   Resp: 17   Temp:  98.2 F (36.8 C)  SpO2: 100%    Vitals:   01/15/24 0000 01/15/24 0200 01/15/24 0400 01/15/24 0820  BP: (!) 163/79  (!) 159/74 (!) 154/78  Pulse: 83 77 83   Resp: 18 18 17    Temp: 98 F (36.7 C)   98.2 F (36.8 C)  TempSrc: Oral  Oral Oral  SpO2: 97% 95% 100%   Weight:      Height:        General: Pt is alert, awake, not in acute distress, she is extremely frail, deconditioned and thin appearing Cardiovascular: RRR, Respiratory: Good air entry, mild rhonchi at the bases Abdominal: Soft, NT, ND, bowel sounds + Extremities: no edema, no cyanosis    The results of significant diagnostics from this hospitalization (including imaging, microbiology, ancillary and laboratory) are listed below for reference.     Microbiology: Recent Results (from the past 240 hours)  Resp panel by RT-PCR (RSV, Flu A&B, Covid) Anterior Nasal Swab     Status: Abnormal   Collection Time: 01/11/24 11:26 AM   Specimen: Anterior Nasal Swab  Result Value Ref Range Status   SARS Coronavirus 2 by RT PCR NEGATIVE NEGATIVE Final   Influenza A by PCR POSITIVE (A) NEGATIVE Final   Influenza B by PCR NEGATIVE NEGATIVE Final    Comment: (NOTE) The Xpert Xpress SARS-CoV-2/FLU/RSV plus assay is intended as an aid in the diagnosis of influenza from Nasopharyngeal swab specimens and should not be used as a sole basis for treatment. Nasal washings and aspirates are unacceptable for Xpert Xpress SARS-CoV-2/FLU/RSV testing.  Fact Sheet for  Patients: bloggercourse.com  Fact Sheet for Healthcare Providers: seriousbroker.it  This test is not yet approved or cleared by the [place]  FDA and has been authorized for detection and/or diagnosis of SARS-CoV-2 by FDA under an Emergency Use Authorization (EUA). This EUA will remain in effect (meaning this test can be used) for the duration of the COVID-19 declaration under Section 564(b)(1) of the Act, 21 U.S.C. section 360bbb-3(b)(1), unless the authorization is terminated or revoked.     Resp Syncytial Virus by PCR NEGATIVE NEGATIVE Final  Comment: (NOTE) Fact Sheet for Patients: bloggercourse.com  Fact Sheet for Healthcare Providers: seriousbroker.it  This test is not yet approved or cleared by the [place]  FDA and has been authorized for detection and/or diagnosis of SARS-CoV-2 by FDA under an Emergency Use Authorization (EUA). This EUA will remain in effect (meaning this test can be used) for the duration of the COVID-19 declaration under Section 564(b)(1) of the Act, 21 U.S.C. section 360bbb-3(b)(1), unless the authorization is terminated or revoked.  Performed at [facility], [address] [place], [place] [address]      Labs: BNP (last 3 results) Recent Labs    05/07/23 0306  BNP 315.8*   Basic Metabolic Panel: Recent Labs  Lab 01/11/24 1139 01/11/24 1835 01/12/24 0359 01/13/24 0544 01/13/24 0720 01/14/24 0408 01/14/24 1548 01/15/24 0321  NA 133*  --  132* 136  --  133*  --  131*  K 3.7  --  3.3* 3.0*  --  5.7* 5.4* 4.9  CL 99  --  99 100  --  101  --  97*  CO2 22  --  22 23  --  23  --  23  GLUCOSE 165*  --  91 106*  --  96  --  83  BUN 12  --  14 8  --  9  --  9  CREATININE 0.78  --  0.76 0.61  --  0.74  --  0.73  CALCIUM  8.8*  --  8.1* 7.5*  --  7.9*  --  8.4*  MG  --  1.8 1.9  --  1.8 2.5*  --  2.2  PHOS  --   --  2.3*  --    --  1.3*  --  2.3*   Liver Function Tests: Recent Labs  Lab 01/11/24 1139 01/12/24 0359  AST 101* 67*  ALT 44 32  ALKPHOS 78 69  BILITOT 0.4 0.3  PROT 7.3 6.6  ALBUMIN 3.8 3.3*   No results for input(s): LIPASE, AMYLASE in the last 168 hours. No results for input(s): AMMONIA in the last 168 hours. CBC: Recent Labs  Lab 01/11/24 1139 01/12/24 0359 01/13/24 0544 01/14/24 0408 01/15/24 0321  WBC 8.1 6.5 6.4 10.2 13.1*  NEUTROABS 6.9  --   --   --   --   HGB 12.8 11.8* 11.9* 11.2* 10.9*  HCT 37.0 33.8* 34.5* 32.6* 31.8*  MCV 85.3 85.4 84.8 85.8 86.2  PLT 183 191 233 277 297   Cardiac Enzymes: Recent Labs  Lab 01/11/24 1835  CKTOTAL 92   BNP: Invalid input(s): POCBNP CBG: No results for input(s): GLUCAP in the last 168 hours. D-Dimer No results for input(s): DDIMER in the last 72 hours. Hgb A1c No results for input(s): HGBA1C in the last 72 hours. Lipid Profile No results for input(s): CHOL, HDL, LDLCALC, TRIG, CHOLHDL, LDLDIRECT in the last 72 hours. Thyroid function studies Recent Labs    01/13/24 0720  TSH 2.530   Anemia work up No results for input(s): VITAMINB12, FOLATE, FERRITIN, TIBC, IRON, RETICCTPCT in the last 72 hours. Urinalysis    Component Value Date/Time   COLORURINE YELLOW 01/11/2024 1111   APPEARANCEUR CLEAR 01/11/2024 1111   LABSPEC 1.020 01/11/2024 1111   PHURINE 7.0 01/11/2024 1111   GLUCOSEU NEGATIVE 01/11/2024 1111   HGBUR MODERATE (A) 01/11/2024 1111   BILIRUBINUR NEGATIVE 01/11/2024 1111   KETONESUR 5 (A) 01/11/2024 1111   PROTEINUR 100 (A) 01/11/2024 1111   NITRITE NEGATIVE  01/11/2024 1111   LEUKOCYTESUR NEGATIVE 01/11/2024 1111   Sepsis Labs Recent Labs  Lab 01/12/24 0359 01/13/24 0544 01/14/24 0408 01/15/24 0321  WBC 6.5 6.4 10.2 13.1*   Microbiology Recent Results (from the past 240 hours)  Resp panel by RT-PCR (RSV, Flu A&B, Covid) Anterior Nasal Swab     Status: Abnormal    Collection Time: 01/11/24 11:26 AM   Specimen: Anterior Nasal Swab  Result Value Ref Range Status   SARS Coronavirus 2 by RT PCR NEGATIVE NEGATIVE Final   Influenza A by PCR POSITIVE (A) NEGATIVE Final   Influenza B by PCR NEGATIVE NEGATIVE Final    Comment: (NOTE) The Xpert Xpress SARS-CoV-2/FLU/RSV plus assay is intended as an aid in the diagnosis of influenza from Nasopharyngeal swab specimens and should not be used as a sole basis for treatment. Nasal washings and aspirates are unacceptable for Xpert Xpress SARS-CoV-2/FLU/RSV testing.  Fact Sheet for Patients: bloggercourse.com  Fact Sheet for Healthcare Providers: seriousbroker.it  This test is not yet approved or cleared by the [place]  FDA and has been authorized for detection and/or diagnosis of SARS-CoV-2 by FDA under an Emergency Use Authorization (EUA). This EUA will remain in effect (meaning this test can be used) for the duration of the COVID-19 declaration under Section 564(b)(1) of the Act, 21 U.S.C. section 360bbb-3(b)(1), unless the authorization is terminated or revoked.     Resp Syncytial Virus by PCR NEGATIVE NEGATIVE Final    Comment: (NOTE) Fact Sheet for Patients: bloggercourse.com  Fact Sheet for Healthcare Providers: seriousbroker.it  This test is not yet approved or cleared by the [place]  FDA and has been authorized for detection and/or diagnosis of SARS-CoV-2 by FDA under an Emergency Use Authorization (EUA). This EUA will remain in effect (meaning this test can be used) for the duration of the COVID-19 declaration under Section 564(b)(1) of the Act, 21 U.S.C. section 360bbb-3(b)(1), unless the authorization is terminated or revoked.  Performed at [facility], [address] [place], [place] [address]      Time coordinating discharge: Over 30 minutes  SIGNED:   Brayton Lye, MD  [facility] 01/15/2024, 11:46 AM Pager   If 7PM-7AM, please contact night-coverage [email or web address]     [1]  Allergies Allergen Reactions   Ciprofloxacin Rash   Wound Dressing Adhesive Other (See Comments)    Irritation if left on too long.

## 2024-01-15 NOTE — Progress Notes (Signed)
 "  Rounding Note   Patient Name: Heidi Spence Date of Encounter: 01/15/2024  [facility] Cardiologist: Redell Shallow, MD   Subjective  Pt awake  Appears comfortable   Daughter in room   Scheduled Meds:  apixaban   2.5 mg Oral BID   atorvastatin   40 mg Oral Daily   calcium -vitamin D  1 tablet Oral BID   diltiazem   10 mg Intravenous Once   magic mouthwash  10 mL Oral QID   metoprolol  tartrate  12.5 mg Oral BID   oseltamivir   30 mg Oral BID   polyethylene glycol  17 g Oral Daily   sodium chloride  flush  3 mL Intravenous Q12H   sodium zirconium cyclosilicate   10 g Oral Once   Continuous Infusions:  cefTRIAXone  (ROCEPHIN )  IV     PRN Meds: acetaminophen  **OR** acetaminophen , albuterol , HYDROcodone -acetaminophen , ipratropium-albuterol , lactulose, morphine  injection, ondansetron  (ZOFRAN ) IV, polyethylene glycol   Vital Signs  Vitals:   01/14/24 2242 01/15/24 0000 01/15/24 0200 01/15/24 0400  BP: (!) 153/88 (!) 163/79  (!) 159/74  Pulse: 97 83 77 83  Resp: 20 18 18 17   Temp: 98.6 F (37 C) 98 F (36.7 C)    TempSrc: Oral Oral  Oral  SpO2: 96% 97% 95% 100%  Weight:      Height:        Intake/Output Summary (Last 24 hours) at 01/15/2024 0738 Last data filed at 01/14/2024 1200 Gross per 24 hour  Intake 480 ml  Output --  Net 480 ml      01/14/2024    3:00 PM 01/13/2024    7:55 AM 01/13/2024    4:37 AM  Last 3 Weights  Weight (lbs) 77 lb 2.6 oz 77 lb 4.8 oz 84 lb 7 oz  Weight (kg) 35 kg 35.063 kg 38.3 kg      Telemetry NSR  - Personally Reviewed  ECG  No new ECG tracing today. - Personally Reviewed  Physical Exam  GEN: Thin frail 83 year old female resting comfortably in no acute distress.   Neck: No JVD Cardiac: RRR. II/VI systolic murmur LSB Respiratory: Mild rhonchi Abd  +HJReflex  Nontender .  MS: No LE  edema;   Labs High Sensitivity Troponin:  No results for input(s): TROPONINIHS in the last 720 hours.  Recent Labs  Lab  01/11/24 1835 01/11/24 2041 01/12/24 0005  TRNPT 25* 26* 27*       Chemistry Recent Labs  Lab 01/11/24 1139 01/11/24 1835 01/12/24 0359 01/13/24 0544 01/13/24 0720 01/14/24 0408 01/14/24 1548 01/15/24 0321  NA 133*  --  132* 136  --  133*  --  131*  K 3.7  --  3.3* 3.0*  --  5.7* 5.4* 4.9  CL 99  --  99 100  --  101  --  97*  CO2 22  --  22 23  --  23  --  23  GLUCOSE 165*  --  91 106*  --  96  --  83  BUN 12  --  14 8  --  9  --  9  CREATININE 0.78  --  0.76 0.61  --  0.74  --  0.73  CALCIUM  8.8*  --  8.1* 7.5*  --  7.9*  --  8.4*  MG  --    < > 1.9  --  1.8 2.5*  --  2.2  PROT 7.3  --  6.6  --   --   --   --   --  ALBUMIN 3.8  --  3.3*  --   --   --   --   --   AST 101*  --  67*  --   --   --   --   --   ALT 44  --  32  --   --   --   --   --   ALKPHOS 78  --  69  --   --   --   --   --   BILITOT 0.4  --  0.3  --   --   --   --   --   GFRNONAA >60  --  >60 >60  --  >60  --  >60  ANIONGAP 12  --  11 13  --  10  --  11   < > = values in this interval not displayed.    Lipids No results for input(s): CHOL, TRIG, HDL, LABVLDL, LDLCALC, CHOLHDL in the last 168 hours.  Hematology Recent Labs  Lab 01/13/24 0544 01/14/24 0408 01/15/24 0321  WBC 6.4 10.2 13.1*  RBC 4.07 3.80* 3.69*  HGB 11.9* 11.2* 10.9*  HCT 34.5* 32.6* 31.8*  MCV 84.8 85.8 86.2  MCH 29.2 29.5 29.5  MCHC 34.5 34.4 34.3  RDW 13.5 13.6 13.6  PLT 233 277 297   Thyroid  Recent Labs  Lab 01/13/24 0720  TSH 2.530    BNPNo results for input(s): BNP, PROBNP in the last 168 hours.  DDimer  Recent Labs  Lab 01/11/24 1835  DDIMER 3.23*     Radiology  ECHOCARDIOGRAM COMPLETE Result Date: 01/14/2024    ECHOCARDIOGRAM REPORT   Patient Name:   Heidi Spence Date of Exam: 01/14/2024 Medical Rec #:  [id number]   Height:       60.0 in Accession #:    [id number]  Weight:       77.3 lb Date of Birth:  November 01, 1940   BSA:          1.246 m Patient Age:    83 years    BP:           158/81 mmHg  Patient Gender: F           HR:           74 bpm. Exam Location:  Inpatient Procedure: 2D Echo, 3D Echo, Cardiac Doppler, Color Doppler and Strain Analysis            (Both Spectral and Color Flow Doppler were utilized during            procedure). Indications:    Atrial Fibrillation I48.91  History:        Patient has prior history of Echocardiogram examinations, most                 recent 07/01/2022. HFimpEF, Arrythmias:Atrial Fibrillation;                 Signs/Symptoms:Dyspnea and Shortness of Breath.  Sonographer:    Koleen Popper RDCS Referring Phys: [id number] DAWOOD GORMAN LYE  Sonographer Comments: Image acquisition challenging due to patient body habitus. Global longitudinal strain was attempted. IMPRESSIONS  1. Left ventricular ejection fraction, by estimation, is 60 to 65%. Left ventricular ejection fraction by 3D volume is 60 %. The left ventricle has normal function. The left ventricle has no regional wall motion abnormalities. There is mild concentric left ventricular hypertrophy. Left ventricular diastolic parameters are consistent with Grade I diastolic dysfunction (impaired relaxation).  2.  Right ventricular systolic function is normal. The right ventricular size is normal. There is severely elevated pulmonary artery systolic pressure. The estimated right ventricular systolic pressure is 73.1 mmHg.  3. Left atrial size was severely dilated.  4. The mitral valve is normal in structure. Mild to moderate mitral valve regurgitation. No evidence of mitral stenosis.  5. Tricuspid valve regurgitation is moderate.  6. The aortic valve is tricuspid. Aortic valve regurgitation is not visualized. Aortic valve sclerosis is present, with no evidence of aortic valve stenosis.  7. The inferior vena cava is dilated in size with <50% respiratory variability, suggesting right atrial pressure of 15 mmHg. FINDINGS  Left Ventricle: Left ventricular ejection fraction, by estimation, is 60 to 65%. Left ventricular ejection  fraction by 3D volume is 60 %. The left ventricle has normal function. The left ventricle has no regional wall motion abnormalities. The left ventricular internal cavity size was normal in size. There is mild concentric left ventricular hypertrophy. Left ventricular diastolic parameters are consistent with Grade I diastolic dysfunction (impaired relaxation). Right Ventricle: The right ventricular size is normal. No increase in right ventricular wall thickness. Right ventricular systolic function is normal. There is severely elevated pulmonary artery systolic pressure. The tricuspid regurgitant velocity is 3.81 m/s, and with an assumed right atrial pressure of 15 mmHg, the estimated right ventricular systolic pressure is 73.1 mmHg. Left Atrium: Left atrial size was severely dilated. Right Atrium: Right atrial size was normal in size. Pericardium: There is no evidence of pericardial effusion. Mitral Valve: The mitral valve is normal in structure. Mild to moderate mitral valve regurgitation. No evidence of mitral valve stenosis. MV peak gradient, 2.3 mmHg. The mean mitral valve gradient is 1.0 mmHg. Tricuspid Valve: The tricuspid valve is normal in structure. Tricuspid valve regurgitation is moderate . No evidence of tricuspid stenosis. Aortic Valve: The aortic valve is tricuspid. Aortic valve regurgitation is not visualized. Aortic valve sclerosis is present, with no evidence of aortic valve stenosis. Pulmonic Valve: The pulmonic valve was normal in structure. Pulmonic valve regurgitation is not visualized. No evidence of pulmonic stenosis. Aorta: The aortic root is normal in size and structure. Venous: The inferior vena cava is dilated in size with less than 50% respiratory variability, suggesting right atrial pressure of 15 mmHg. IAS/Shunts: No atrial level shunt detected by color flow Doppler. Additional Comments: 3D was performed not requiring image post processing on an independent workstation and was normal.   LEFT VENTRICLE PLAX 2D LVIDd:         4.20 cm         Diastology LVIDs:         2.40 cm         LV e' medial:    6.81 cm/s LV PW:         0.90 cm         LV E/e' medial:  10.6 LV IVS:        0.90 cm         LV e' lateral:   8.43 cm/s LVOT diam:     1.70 cm         LV E/e' lateral: 8.5 LV SV:         39 LV SV Index:   31 LVOT Area:     2.27 cm        3D Volume EF LV IVRT:       102 msec        LV 3D EF:  Left                                             ventricul                                             ar LV Volumes (MOD)                            ejection LV vol d, MOD    66.6 ml                    fraction A2C:                                        by 3D LV vol s, MOD    23.4 ml                    volume is A2C:                                        60 %. LV SV MOD A2C:   43.2 ml                                 3D Volume EF:                                3D EF:        60 %                                LV EDV:       93 ml                                LV ESV:       37 ml                                LV SV:        56 ml RIGHT VENTRICLE             IVC RV S prime:     10.80 cm/s  IVC diam: 2.21 cm TAPSE (M-mode): 1.9 cm LEFT ATRIUM             Index        RIGHT ATRIUM           Index LA diam:        4.33 cm 3.47 cm/m   RA Area:     15.30 cm LA Vol (A2C):   53.0 ml 42.53 ml/m  RA Volume:   37.90 ml  30.41 ml/m LA Vol (A4C):   72.3 ml 58.02 ml/m LA Biplane Vol: 64.2 ml 51.52 ml/m  AORTIC VALVE LVOT Vmax:   80.50 cm/s LVOT Vmean:  51.500 cm/s LVOT VTI:    0.171 m  AORTA Ao Root diam: 2.50 cm Ao Asc diam:  3.42 cm MITRAL VALVE               TRICUSPID VALVE MV Area (PHT): 3.42 cm    TR Peak grad:   58.1 mmHg MV Area VTI:   1.99 cm    TR Vmax:        381.00 cm/s MV Peak grad:  2.3 mmHg MV Mean grad:  1.0 mmHg    SHUNTS MV Vmax:       0.76 m/s    Systemic VTI:  0.17 m MV Vmean:      49.8 cm/s   Systemic Diam: 1.70 cm MV Decel Time: 222 msec MR Peak grad: 205.1 mmHg MR Mean grad: 1.2 mmHg MR Vmax:       716.00 cm/s MR Vmean:     528.0 cm/s MV E velocity: 71.90 cm/s MV A velocity: 47.90 cm/s MV E/A ratio:  1.50 Morene Brownie Electronically signed by Morene Brownie Signature Date/Time: 01/14/2024/5:52:14 PM    Final    DG Abd Portable 1V Result Date: 01/14/2024 CLINICAL DATA:  Constipation.  Nausea. EXAM: DG ABD PORTABLE 1V COMPARISON:  CT 01/11/2024 FINDINGS: Small volume of formed stool within the descending and sigmoid colon. Air within slightly dilated ascending and transverse colon. No small bowel dilatation or evidence of obstruction. Vascular calcifications are seen. IMPRESSION: Small volume of formed stool in the descending and sigmoid colon. Air within slightly dilated ascending and transverse colon. No evidence of obstruction. Electronically Signed   By: Andrea Gasman M.D.   On: 01/14/2024 14:50    Cardiac Studies  Echocardiogram 07/01/2022: Impressions: 1. Left ventricular ejection fraction, by estimation, is 60 to 65%. The  left ventricle has normal function. The left ventricle has no regional  wall motion abnormalities. Left ventricular diastolic parameters are  consistent with Grade I diastolic  dysfunction (impaired relaxation).   2. Right ventricular systolic function is normal. The right ventricular  size is normal. There is mildly elevated pulmonary artery systolic  pressure.   3. Left atrial size was moderately dilated.   4. Right atrial size was mildly dilated.   5. The mitral valve is abnormal. Mild mitral valve regurgitation. No  evidence of mitral stenosis.   6. Calcified non coronary cusp. The aortic valve is tricuspid. There is  mild calcification of the aortic valve. Aortic valve regurgitation is not  visualized. Aortic valve sclerosis is present, with no evidence of aortic  valve stenosis.   7. The inferior vena cava is normal in size with greater than 50%  respiratory variability, suggesting right atrial pressure of 3 mmHg.  _______________  Echo    01/14/24 1. Left ventricular ejection fraction, by estimation, is 60 to 65%. Left  ventricular ejection fraction by 3D volume is 60 %. The left ventricle has  normal function. The left ventricle has no regional wall motion  abnormalities. There is mild concentric  left ventricular hypertrophy. Left ventricular diastolic parameters are  consistent with Grade I diastolic dysfunction (impaired relaxation).   2. Right ventricular systolic function is normal. The right ventricular  size is normal. There is severely elevated pulmonary artery systolic  pressure. The estimated right ventricular systolic pressure is 73.1 mmHg.   3. Left atrial size was severely dilated.   4. The mitral valve is normal in structure. Mild to moderate mitral valve  regurgitation. No evidence of mitral stenosis.   5. Tricuspid valve regurgitation is moderate.   6. The aortic valve is tricuspid. Aortic valve regurgitation is not  visualized. Aortic valve sclerosis is present, with no evidence of aortic  valve stenosis.   7. The inferior vena cava is dilated in size with <50% respiratory  variability, suggesting right atrial pressure of 15 mmHg.   Myoview  07/03/2022: Impressions: 1. No reversible ischemia or infarction. 2. Normal left ventricular wall motion. 3. Left ventricular ejection fraction 90% 4. Non invasive risk stratification*: Low  Patient Profile   83 y.o. female with a history of chronic HFrEF with EF as low as 40-45% in the past but improved to 60-65% on last Echo in 06/2022, paroxysmal SVT, hypertension, hyperlipidemia, prediabetes, COPD, bronchiectasis, trigeminal neuralgia, and osteoporosis who was admitted on 01/11/2024 after presenting with a fall resulting in a T12 compression fracture. Family also reported a mild fever at home and was found to have influenza A, viral vs bacterial pneumonia, and colitis. Cardiology was consulted on 01/13/2024 for new onset atrial fibrillation.  Assessment & Plan    New Onset Atrial Fibrillation Patient has a history of SVT. She presented in sinus rhythm but then went into rapid atrial fibrillation with rates as high as the 140s in the setting of influenza A, viral vs bacterial pneumonia, and colitis. Spontaneously converted to sinus rhythm with frequent PACs. - Maintaining sinus rhythm with rates in the 60s to 70s. - Continue Lopressor  12.5mg  twice daily. She previously had some mild bradycardia with on higher doses of Metoprolol  so will need to watch this closely.  - CHA2DS2-VASc = 6 (aortoiliac atherosclerosis noted on CT, CHF, HTN, age x2, female). She has been started on Eliquis  2.5mg  twice daily (reduced dose due to age and weight). She is a very frail 83 year old but family states she is very active and does not usual fall. Falls seems to be due to acute illness. - With valvular dz she is at higher risk for recurrence Plan to follow as outpatient.   Chronic HFrEF with Improved EF EF as low as 40-45% in the past but has subsequently improved. Last Echo in 06/2022 showed LVEF of 60-65%   Echo yesterday shows LVEF and RVEF are normal   Mod TR with severely elevated estimated PAP (70mm)   Mod MR   IVC dilated     I would give 1 dose of IV lasix  (20 mg ) IV and follow response prior to d/c    Her sats are good but BP is up     FOllow response    Abnormal EKG Initial EKG on admission showed normal sinus rhythm with diffuse T wave inversions in inferior leads and leads V2-V6 with deep T wave inversions and slight ST depression in leads V3-V5. She has had similar changes on EKGs dating back to 06/2022. Echo at that time showed normal LV function and Myoview  was low risk with no evidence of ischemia or infarction. Repeat EKG on 12/27 while patient was in atrial fibrillation showed T wave inversions had essentially resolved. - No chest pain or anginal symptoms.  - LVEF normal   Hypertension BP mildly elevated.  - Continue Lopressor  12.5mg  twice daily.  -  With postassium elevation I wouldavoid ACEI or ARBs for now    Follow over time    Could add low dose amlodipine  2.5 mg, increase metoprolol  to 25 bid and follow BP and HR  Hyperlipidemia - Continue Lipitor 40mg  daily.  Hypokalemia Hyperkalemia Potassium 4.9 today    Follow   Prolonged QTc  QTc 486 today.  Better than a couple days ago    - Avoid QTc prolonging agents.   Otherwise, per primary team: - Influenza A - Viral vs bacterial pneumonia  - Acute colitis  - T12 compression fracture  - Elevated d-dimer: CTA negative for PE - Hyponatermia - COPD - Bronchiectasis - Prediabetes - Constipation  Reassess BP and urine output today   WIll make sure she has early follow up in cardiology   For questions or updates, please contact [facility] Please consult [email or web address] for contact info under     Signed, Vina Gull, MD  01/15/2024, 7:38 AM    PT seen and examined   I agree with fidngs as noted by JAYSON Door above  PT comfortable laying in bed Neck:  JVP is normal  Lungs are CTA Cardiac   RRR  No S3  No murmurs Abd supple Ext without edema   Impression   1  Atrial fibrillation.   Pt in SR now     EKG being done  Keep on lopressor  and now Eliquis   Follow HR   Follow Hgb  2  Hx HFrEF   Echo pending   3  HTN  BP is mildly increased   Would make sure that K stabilized before adding ACE I  4  QT prolongation   Repeat EKG being done   Will review/compare Avoid drugs that prolong  Vina Gull MD   "

## 2024-01-16 ENCOUNTER — Other Ambulatory Visit (HOSPITAL_COMMUNITY): Payer: Self-pay

## 2024-01-28 ENCOUNTER — Other Ambulatory Visit (HOSPITAL_COMMUNITY): Payer: Self-pay

## 2024-01-30 ENCOUNTER — Ambulatory Visit: Admitting: Emergency Medicine

## 2024-02-22 ENCOUNTER — Ambulatory Visit: Admitting: Emergency Medicine
